# Patient Record
Sex: Female | Born: 1985 | Race: White | Hispanic: No | Marital: Married
Health system: Midwestern US, Community
[De-identification: ages and names within clinical notes are randomized; demographics above are authoritative.]

## PROBLEM LIST (undated history)

## (undated) DIAGNOSIS — Z8619 Personal history of other infectious and parasitic diseases: Secondary | ICD-10-CM

## (undated) DIAGNOSIS — M5136 Other intervertebral disc degeneration, lumbar region: Secondary | ICD-10-CM

## (undated) DIAGNOSIS — Z803 Family history of malignant neoplasm of breast: Secondary | ICD-10-CM

## (undated) DIAGNOSIS — Z800 Family history of malignant neoplasm of digestive organs: Secondary | ICD-10-CM

## (undated) DIAGNOSIS — M51369 Other intervertebral disc degeneration, lumbar region without mention of lumbar back pain or lower extremity pain: Secondary | ICD-10-CM

## (undated) DIAGNOSIS — Z8042 Family history of malignant neoplasm of prostate: Secondary | ICD-10-CM

## (undated) DIAGNOSIS — G4730 Sleep apnea, unspecified: Secondary | ICD-10-CM

## (undated) DIAGNOSIS — F32A Depression, unspecified: Secondary | ICD-10-CM

## (undated) DIAGNOSIS — J45909 Unspecified asthma, uncomplicated: Secondary | ICD-10-CM

## (undated) HISTORY — DX: Unspecified asthma, uncomplicated: J45.909

## (undated) HISTORY — DX: Family history of malignant neoplasm of breast: Z80.3

## (undated) HISTORY — DX: Personal history of other infectious and parasitic diseases: Z86.19

## (undated) HISTORY — DX: Other intervertebral disc degeneration, lumbar region: M51.36

## (undated) HISTORY — DX: Depression, unspecified: F32.A

## (undated) HISTORY — DX: Family history of malignant neoplasm of prostate: Z80.42

## (undated) HISTORY — DX: Sleep apnea, unspecified: G47.30

## (undated) HISTORY — DX: Family history of malignant neoplasm of digestive organs: Z80.0

## (undated) HISTORY — PX: FRACTURE SURGERY: SHX138

## (undated) HISTORY — PX: WISDOM TOOTH EXTRACTION: SHX21

## (undated) HISTORY — DX: Other intervertebral disc degeneration, lumbar region without mention of lumbar back pain or lower extremity pain: M51.369

## (undated) HISTORY — PX: FOREARM FRACTURE SURGERY: SHX649

---

## 2014-09-17 ENCOUNTER — Inpatient Hospital Stay
Admit: 2014-09-17 | Discharge: 2014-09-17 | Disposition: A | Discharge status: Home / Self Care | Attending: Emergency Medicine

## 2014-09-17 NOTE — ED Provider Notes (Signed)
PATIENT:          Nancy Chung, Nancy Chung         DOS:           09/17/2014  MR #:             [id number]             ACCOUNT #:     [id number]  DATE OF BIRTH:    Jun 14, 1986              AGE:           28      HISTORY OF PRESENT ILLNESS:    PERTINENT HISTORY OF PRESENT  ILLNESS. Patient seen under the  supervision of  Dr. Oscar Laaniel Celik.  Patient presents to the emergency department with 8  hour  history of back spasm.  She states she was bending over at [employer]  this  evening, when she had the acute onset of some pain in her lumbar back.  She  describes this as a throbbing spasm sensation.  Patient has a history  of back  spasms and has taken Flexeril in the past, with improvement of her  symptoms.  She states she tried to sleep, but could not secondary to pain.    PERTINENT PAST/ FAMILY/SOCIAL HISTORY Medical: None  Surgical: None  Social: Denies alcohol, tobacco, or other drug use      PHYSICAL EXAM Vital signs unremarkable  General: Well-nourished, well-developed female in mild distress  secondary to  pain  Back: Lumbar spinal and paraspinal tenderness to palpation, no CVA  tenderness,  and straight leg raise negative bilaterally the seated position  Extremities: Nontender, no edema  Neuro: Alert and oriented x3, strength and sensation in all 4  extremities    MEDICAL DECISION MAKING:    SIGNIFICANT FINDINGS/ED COURSE/MEDICAL DECISION MAKING/TREATMENT  PLAN Patient  was given Norflex and Toradol injections for her pain.  Given her  history of  back spasms and no recent history of trauma, we feel there is no need  to obtain  imaging or lab work this time.  Upon reassessment at 0 800, patient  reported  significant improvement of her back pain.  She will be discharged home  with a  prescription for Flexeril and Naprosyn.  Patient was instructed to  followup  with her primary care provider.    PROBLEM LIST:       Admit Reason:     Back Pain: Entered Date: 17-Sep-2014 05:08, Entered By:  INTERFACES,  INTERFACES       DIAGNOSIS 1.  Lumbar back spasm, acute      ADDITIONAL INFORMATION The Emergency Medicine attending physician  was present  in the Emergency Department, who reviewed case management, and  approved  evaluation/treatment.    Electronic Signatures:  Oscar LaELIK, DANIEL (MD)  (Signed 17-Sep-2014 08:59)   Co-Signer: HISTORY OF PRESENT ILLNESS, PROBLEM LIST  Ananias PilgrimSTEUBS, Kiandra Sanguinetti T (MD)  (Signed 17-Sep-2014 08:20)   Authored: HISTORY OF PRESENT ILLNESS, PHYSICAL EXAM, MEDICAL DECISION  MAKING,  PROBLEM LIST, DIAGNOSIS, Additional Infomation      Last Updated: 17-Sep-2014 08:59 by Oscar LaELIK, DANIEL (MD)            Please see T-Sheet, initial assessment, and physician orders for  further details.    Dictating Physician: Clover MealyJohn Alyannah Sanks, MD  Original Electronic Signature Date: 09/17/2014 05:37 A  JTS  Document #: [id number]    cc:  PCP No       So[place]

## 2015-05-03 LAB — PAP SMEAR

## 2017-05-23 ENCOUNTER — Other Ambulatory Visit: Payer: Self-pay | Admitting: Family Medicine

## 2017-05-23 ENCOUNTER — Encounter: Payer: Self-pay | Admitting: Family Medicine

## 2017-05-23 ENCOUNTER — Ambulatory Visit (INDEPENDENT_AMBULATORY_CARE_PROVIDER_SITE_OTHER): Payer: BC Managed Care – PPO | Admitting: Family Medicine

## 2017-05-23 VITALS — BP 120/70 | HR 67 | Ht 69.5 in | Wt 215.0 lb

## 2017-05-23 DIAGNOSIS — Z0000 Encounter for general adult medical examination without abnormal findings: Secondary | ICD-10-CM

## 2017-05-23 DIAGNOSIS — Z8349 Family history of other endocrine, nutritional and metabolic diseases: Secondary | ICD-10-CM

## 2017-05-23 LAB — CBC WITH DIFFERENTIAL/PLATELET
Basophils Absolute: 0 cells/uL (ref 0–200)
Basophils Relative: 0 %
EOS PCT: 1 %
Eosinophils Absolute: 71 cells/uL (ref 15–500)
HCT: 41.6 % (ref 35.0–45.0)
HEMOGLOBIN: 13.8 g/dL (ref 11.7–15.5)
LYMPHS ABS: 1562 {cells}/uL (ref 850–3900)
Lymphocytes Relative: 22 %
MCH: 28.8 pg (ref 27.0–33.0)
MCHC: 33.2 g/dL (ref 32.0–36.0)
MCV: 86.8 fL (ref 80.0–100.0)
MPV: 9.7 fL (ref 7.5–12.5)
Monocytes Absolute: 568 cells/uL (ref 200–950)
Monocytes Relative: 8 %
NEUTROS PCT: 69 %
Neutro Abs: 4899 cells/uL (ref 1500–7800)
PLATELETS: 236 10*3/uL (ref 140–400)
RBC: 4.79 MIL/uL (ref 3.80–5.10)
RDW: 13.4 % (ref 11.0–15.0)
WBC: 7.1 10*3/uL (ref 4.0–10.5)

## 2017-05-23 LAB — COMPREHENSIVE METABOLIC PANEL
ALBUMIN: 4.5 g/dL (ref 3.6–5.1)
ALT: 17 U/L (ref 6–29)
AST: 16 U/L (ref 10–30)
Alkaline Phosphatase: 55 U/L (ref 33–115)
BILIRUBIN TOTAL: 1.1 mg/dL (ref 0.2–1.2)
BUN: 10 mg/dL (ref 7–25)
CO2: 24 mmol/L (ref 20–31)
CREATININE: 0.71 mg/dL (ref 0.50–1.10)
Calcium: 9.1 mg/dL (ref 8.6–10.2)
Chloride: 103 mmol/L (ref 98–110)
GLUCOSE: 91 mg/dL (ref 65–99)
Potassium: 4.7 mmol/L (ref 3.5–5.3)
SODIUM: 138 mmol/L (ref 135–146)
Total Protein: 7.2 g/dL (ref 6.1–8.1)

## 2017-05-23 LAB — POCT URINALYSIS DIP (PROADVANTAGE DEVICE)
Bilirubin, UA: NEGATIVE
Blood, UA: NEGATIVE
Glucose, UA: NEGATIVE mg/dL
Ketones, POC UA: NEGATIVE mg/dL
LEUKOCYTES UA: NEGATIVE
Nitrite, UA: NEGATIVE
PROTEIN UA: NEGATIVE mg/dL
SPECIFIC GRAVITY, URINE: 1.03
UUROB: NEGATIVE
pH, UA: 6 (ref 5.0–8.0)

## 2017-05-23 LAB — LIPID PANEL
Cholesterol: 197 mg/dL (ref <200)
HDL: 36 mg/dL — ABNORMAL LOW (ref >50)
LDL CALC: 131 mg/dL — AB (ref <100)
Total CHOL/HDL Ratio: 5.5 Ratio — ABNORMAL HIGH (ref <5.0)
Triglycerides: 148 mg/dL (ref <150)
VLDL: 30 mg/dL (ref <30)

## 2017-05-23 LAB — TSH: TSH: 0.36 mIU/L — ABNORMAL LOW

## 2017-05-23 NOTE — Progress Notes (Signed)
Subjective:    Patient ID: Dawn Barajas, female    DOB: 12/28/1985, 31 y.o.   MRN: [id number]  HPI Chief Complaint  Patient presents with  . fasting cpe    fasting cpe, wants to be referred to gynecology for pap smears. [place]   She is new to the practice and here for a complete physical exam. Previous medical care: moved here from [place] in [place]. Had a baby 6 months ago. States she is not breast feeding. Vaginal birth and denies having any issues with blood sugars or BP during pregnancy. She would like to establish with an OB/GYN here.  Last CPE: no PCP in the past but OB/GYN visits.   History of back pain and lumbar DDD. No issues in 2-3 years.   Other providers: none here  Social history: Lives with her female spouse, works at [employer] at [employer].   Diet: fairly healthy. Cook at home.  Excerise: 2-3 days per week. Low impact.   Immunizations: Tdap in past year.   Health maintenance:  Mammogram: normal at age 31.  Colonoscopy: N/A Last Gynecological Exam: June 2017 and normal.  Last Menstrual cycle: 04/26/2017  Pregnancies: 1 Last Dental Exam: twice annually  Last Eye Exam: January 2018  Wears seatbelt always, uses sunscreen, smoke detectors in home and functioning, does not text while driving and feels safe in home environment.   Reviewed allergies, medications, past medical, surgical, family, and social history.    Review of Systems Review of Systems Constitutional: -fever, -chills, -sweats, -unexpected weight change,-fatigue ENT: -runny nose, -ear pain, -sore throat Cardiology:  -chest pain, -palpitations, -edema Respiratory: -cough, -shortness of breath, -wheezing Gastroenterology: -abdominal pain, -nausea, -vomiting, -diarrhea, -constipation  Hematology: -bleeding or bruising problems Musculoskeletal: -arthralgias, -myalgias, -joint swelling, -back pain Ophthalmology: -vision changes Urology: -dysuria, -difficulty urinating,  -hematuria, -urinary frequency, -urgency Neurology: -headache, -weakness, -tingling, -numbness       Objective:   Physical Exam BP 120/70   Pulse 67   Ht 5' 9.5" (1.765 m)   Wt 215 lb (97.5 kg)   LMP 04/26/2017   BMI 31.29 kg/m   General Appearance:    Alert, cooperative, no distress, appears stated age  Head:    Normocephalic, without obvious abnormality, atraumatic  Eyes:    PERRL, conjunctiva/corneas clear, EOM's intact, fundi    benign  Ears:    Normal TM's and external ear canals  Nose:   Nares normal, mucosa normal, no drainage or sinus   tenderness  Throat:   Lips, mucosa, and tongue normal; teeth and gums normal  Neck:   Supple, no lymphadenopathy;  thyroid:  no   enlargement/tenderness/nodules; no carotid   bruit or JVD  Back:    Spine nontender, no curvature, ROM normal, no CVA     tenderness  Lungs:     Clear to auscultation bilaterally without wheezes, rales or     ronchi; respirations unlabored  Chest Wall:    No tenderness or deformity   Heart:    Regular rate and rhythm, S1 and S2 normal, no murmur, rub   or gallop  Breast Exam:    Done in past 6 months.declines.  No axillary lymphadenopathy  Abdomen:     Soft, non-tender, nondistended, normoactive bowel sounds,    no masses, no hepatosplenomegaly  Genitalia:    Done in past 6 months.   Rectal:    Not performed due to age<40 and no related complaints  Extremities:  No clubbing, cyanosis or edema  Pulses:   2+ and symmetric all extremities  Skin:   Skin color, texture, turgor normal, no rashes or lesions  Lymph nodes:   Cervical, supraclavicular, and axillary nodes normal  Neurologic:   CNII-XII intact, normal strength, sensation and gait; reflexes 2+ and symmetric throughout          Psych:   Normal mood, affect, hygiene and grooming.     Urinalysis dipstick: neg      Assessment & Plan:  Routine general medical examination at a health care facility - Plan: POCT Urinalysis DIP (Proadvantage Device), CBC  with Differential/Platelet, Comprehensive metabolic panel, TSH, Lipid panel  Family history of thyroid disease in mother - Plan: TSH  Discussed that she appears to be healthy overall. Her son has hand, foot and mouth and discussed that if she does get this that supportive care is the treatment plan.  Counseled on healthy diet and exercise.  She will schedule with an OB/GYN per her preference. Numbers provided.  She is up to date on immunizations and health maintenance.  Labs done and will follow up.

## 2017-05-23 NOTE — Patient Instructions (Signed)
You can call and schedule your Dentist appointment at any of the following offices:   Dawn Barajas Address: [address]  [place], [place] [address] Phone #: [phone]  Dawn Barajas DDS Address: [address] [place], [place] [address] Phone # [phone]  Dawn Barajas, DDS [facility]  Address: [address]                                                                 [place], [place] [address] Phone #: [phone]  Here is a list of Eye Doctors that you call and schedule an appointment with.   [facility] Address: [address] [place], [place] [address] Phone: [phone]   [facility] Address: [address], [place], [place] [address] Phone: [phone]  Dr. Dione Booze Address: [address] [place], [place] [address] Phone: [phone]  [facility] [address] [place], [place]  [address] Telephone: [phone]  Preventative Care for Adults - Female      MAINTAIN REGULAR HEALTH EXAMS:  A routine yearly physical is a good way to check in with your primary care provider about your health and preventive screening. It is also an opportunity to share updates about your health and any concerns you have, and receive a thorough all-over exam.   Most health insurance companies pay for at least some preventative services.  Check with your health plan for specific coverages.  WHAT PREVENTATIVE SERVICES DO WOMEN NEED?  Adult women should have their weight and blood pressure checked regularly.   Women age 54 and older should have their cholesterol levels checked regularly.  Women should be screened for cervical cancer with a Pap smear and pelvic exam beginning at either age 27, or 3 years after they become sexually activity.    Breast cancer screening generally begins at age 94 with a mammogram and breast exam by your primary care provider.    Beginning at age 11 and  continuing to age 77, women should be screened for colorectal cancer.  Certain people may need continued testing until age 17.  Updating vaccinations is part of preventative care.  Vaccinations help protect against diseases such as the flu.  Osteoporosis is a disease in which the bones lose minerals and strength as we age. Women ages 32 and over should discuss this with their caregivers, as should women after menopause who have other risk factors.  Lab tests are generally done as part of preventative care to screen for anemia and blood disorders, to screen for problems with the kidneys and liver, to screen for bladder problems, to check blood sugar, and to check your cholesterol level.  Preventative services generally include counseling about diet, exercise, avoiding tobacco, drugs, excessive alcohol consumption, and sexually transmitted infections.    GENERAL RECOMMENDATIONS FOR GOOD HEALTH:  Healthy diet:  Eat a variety of foods, including fruit, vegetables, animal or vegetable protein, such as meat, fish, chicken, and eggs, or beans, lentils, tofu, and grains, such as rice.  Drink plenty of water daily.  Decrease saturated fat in the diet, avoid lots of red meat, processed foods, sweets, fast foods, and fried foods.  Exercise:  Aerobic exercise helps maintain good heart health.  At least 30-40 minutes of moderate-intensity exercise is recommended. For example, a brisk walk that increases your heart rate and breathing. This should be done on most days of the week.   Find a type of exercise or a variety of exercises that you enjoy so that it becomes a part of your daily life.  Examples are running, walking, swimming, water aerobics, and biking.  For motivation and support, explore group exercise such as aerobic class, spin class, Zumba, Yoga,or  martial arts, etc.    Set exercise goals for yourself, such as a certain weight goal, walk or run in a race such as a 5k walk/run.  Speak to your  primary care provider about exercise goals.  Disease prevention:  If you smoke or chew tobacco, find out from your caregiver how to quit. It can literally save your life, no matter how long you have been a tobacco user. If you do not use tobacco, never begin.   Maintain a healthy diet and normal weight. Increased weight leads to problems with blood pressure and diabetes.   The Body Mass Index or BMI is a way of measuring how much of your body is fat. Having a BMI above 27 increases the risk of heart disease, diabetes, hypertension, stroke and other problems related to obesity. Your caregiver can help determine your BMI and based on it develop an exercise and dietary program to help you achieve or maintain this important measurement at a healthful level.  High blood pressure causes heart and blood vessel problems.  Persistent high blood pressure should be treated with medicine if weight loss and exercise do not work.   Fat and cholesterol leaves deposits in your arteries that can block them. This causes heart disease and vessel disease elsewhere in your body.  If your cholesterol is found to be high, or if you have heart disease or certain other medical conditions, then you may need to have your cholesterol monitored frequently and be treated with medication.   Ask if you should have a cardiac stress test if your history suggests this. A stress test is a test done on a treadmill that looks for heart disease. This test can find disease prior to there being a problem.  Menopause can be associated with physical symptoms and risks. Hormone replacement therapy is available to decrease these. You should talk to your caregiver about whether starting or continuing to take hormones is right for you.   Osteoporosis is a disease in which the bones lose minerals and strength as we age. This can result in serious bone fractures. Risk of osteoporosis can be identified using a bone density scan. Women ages 21 and  over should discuss this with their caregivers, as should women after menopause who have other risk factors. Ask your caregiver whether you should be taking a calcium supplement and Vitamin D, to reduce the rate of osteoporosis.   Avoid drinking alcohol in excess (more than two drinks per day).  Avoid use of street drugs. Do not share needles with anyone. Ask for professional help if you need assistance or instructions on stopping the use of alcohol, cigarettes, and/or drugs.  Brush your teeth twice a day with fluoride toothpaste, and floss once a day. Good oral hygiene prevents tooth decay and gum disease. The problems can be painful, unattractive, and can cause other health problems. Visit your dentist for a routine oral and dental check up and preventive care every 6-12 months.   Look at your skin regularly.  Use a mirror to look at your back. Notify your caregivers of changes in moles, especially if there are changes in shapes, colors, a size larger than a pencil eraser, an irregular border, or development of new moles.  Safety:  Use seatbelts 100% of the time, whether driving or as a passenger.  Use safety devices such as hearing protection if you work in environments with loud noise or significant background noise.  Use safety glasses when doing any work that could send debris in to the eyes.  Use a helmet if you ride a bike or motorcycle.  Use appropriate safety gear for contact sports.  Talk to your caregiver about gun safety.  Use sunscreen with a SPF (or skin protection factor) of 15 or greater.  Lighter skinned people are at a greater risk of skin cancer. Don't forget to also wear sunglasses in order to protect your eyes from too much damaging sunlight. Damaging sunlight can accelerate cataract formation.   Practice safe sex. Use condoms. Condoms are used for birth control and to help reduce the spread of sexually transmitted infections (or STIs).  Some of the STIs are gonorrhea (the clap),  chlamydia, syphilis, trichomonas, herpes, HPV (human papilloma virus) and HIV (human immunodeficiency virus) which causes AIDS. The herpes, HIV and HPV are viral illnesses that have no cure. These can result in disability, cancer and death.   Keep carbon monoxide and smoke detectors in your home functioning at all times. Change the batteries every 6 months or use a model that plugs into the wall.   Vaccinations:  Stay up to date with your tetanus shots and other required immunizations. You should have a booster for tetanus every 10 years. Be sure to get your flu shot every year, since 5%-20% of the [place] population comes down with the flu. The flu vaccine changes each year, so being vaccinated once is not enough. Get your shot in the fall, before the flu season peaks.   Other vaccines to consider:  Human Papilloma Virus or HPV causes cancer of the cervix, and other infections that can be transmitted from person to person. There is a vaccine for HPV, and females should get immunized between the ages of 3011 and 426. It requires a series of 3 shots.   Pneumococcal vaccine to protect against certain types of pneumonia.  This is normally recommended for adults age 31 or older.  However, adults younger than 31 years old with certain underlying conditions such as diabetes, heart or lung disease should also receive the vaccine.  Shingles vaccine to protect against Varicella Zoster if you are older than age 31, or younger than 31 years old with certain underlying illness.  Hepatitis A vaccine to protect against a form of infection of the liver by a virus acquired from food.  Hepatitis B vaccine to protect against a form of infection of the liver by a virus acquired from blood or body fluids, particularly if you work in health care.  If you plan to travel internationally, check with your local health department for specific vaccination recommendations.  Cancer Screening:  Breast cancer screening is  essential to preventive care for women. All women age 31 and older should perform a breast self-exam every month. At age 31 and older, women should have their caregiver complete a breast exam each year. Women at ages 6740 and older should have a mammogram (x-ray film) of the breasts. Your caregiver can discuss how often you need mammograms.    Cervical cancer  screening includes taking a Pap smear (sample of cells examined under a microscope) from the cervix (end of the uterus). It also includes testing for HPV (Human Papilloma Virus, which can cause cervical cancer). Screening and a pelvic exam should begin at age 31, or 3 years after a woman becomes sexually active. Screening should occur every year, with a Pap smear but no HPV testing, up to age 79. After age 16, you should have a Pap smear every 3 years with HPV testing, if no HPV was found previously.   Most routine colon cancer screening begins at the age of 50. On a yearly basis, doctors may provide special easy to use take-home tests to check for hidden blood in the stool. Sigmoidoscopy or colonoscopy can detect the earliest forms of colon cancer and is life saving. These tests use a small camera at the end of a tube to directly examine the colon. Speak to your caregiver about this at age 15, when routine screening begins (and is repeated every 5 years unless early forms of pre-cancerous polyps or small growths are found).

## 2017-05-26 LAB — T3: T3, Total: 90.6 ng/dL (ref 76–181)

## 2017-05-26 LAB — T4, FREE: FREE T4: 1.2 ng/dL (ref 0.8–1.8)

## 2017-05-29 ENCOUNTER — Encounter: Payer: Self-pay | Admitting: Internal Medicine

## 2017-07-09 ENCOUNTER — Encounter: Payer: Self-pay | Admitting: Family Medicine

## 2017-07-09 ENCOUNTER — Ambulatory Visit (INDEPENDENT_AMBULATORY_CARE_PROVIDER_SITE_OTHER): Payer: BC Managed Care – PPO | Admitting: Family Medicine

## 2017-07-09 VITALS — BP 110/72 | HR 68 | Temp 98.0°F | Wt 211.4 lb

## 2017-07-09 DIAGNOSIS — J029 Acute pharyngitis, unspecified: Secondary | ICD-10-CM

## 2017-07-09 LAB — POCT RAPID STREP A (OFFICE): RAPID STREP A SCREEN: NEGATIVE

## 2017-07-09 MED ORDER — AMOXICILLIN 875 MG PO TABS: 875.0000 mg | ORAL_TABLET | Freq: Two times a day (BID) | ORAL | 0 refills | Status: DC

## 2017-07-09 NOTE — Patient Instructions (Signed)
Complete the antibiotic, do salt water gargles, stay hydrated. You can take tylenol or ibuprofen as needed.  Let me know if you have any new or worsening symptoms or if you are not back to baseline after completing the antibiotic.   Pharyngitis Pharyngitis is redness, pain, and swelling (inflammation) of your pharynx. What are the causes? Pharyngitis is usually caused by infection. Most of the time, these infections are from viruses (viral) and are part of a cold. However, sometimes pharyngitis is caused by bacteria (bacterial). Pharyngitis can also be caused by allergies. Viral pharyngitis may be spread from person to person by coughing, sneezing, and personal items or utensils (cups, forks, spoons, toothbrushes). Bacterial pharyngitis may be spread from person to person by more intimate contact, such as kissing. What are the signs or symptoms? Symptoms of pharyngitis include:  Sore throat.  Tiredness (fatigue).  Low-grade fever.  Headache.  Joint pain and muscle aches.  Skin rashes.  Swollen lymph nodes.  Plaque-like film on throat or tonsils (often seen with bacterial pharyngitis).  How is this diagnosed? Your health care provider will ask you questions about your illness and your symptoms. Your medical history, along with a physical exam, is often all that is needed to diagnose pharyngitis. Sometimes, a rapid strep test is done. Other lab tests may also be done, depending on the suspected cause. How is this treated? Viral pharyngitis will usually get better in 3-4 days without the use of medicine. Bacterial pharyngitis is treated with medicines that kill germs (antibiotics). Follow these instructions at home:  Drink enough water and fluids to keep your urine clear or pale yellow.  Only take over-the-counter or prescription medicines as directed by your health care provider: ? If you are prescribed antibiotics, make sure you finish them even if you start to feel better. ? Do  not take aspirin.  Get lots of rest.  Gargle with 8 oz of salt water ( tsp of salt per 1 qt of water) as often as every 1-2 hours to soothe your throat.  Throat lozenges (if you are not at risk for choking) or sprays may be used to soothe your throat. Contact a health care provider if:  You have large, tender lumps in your neck.  You have a rash.  You cough up green, yellow-brown, or bloody spit. Get help right away if:  Your neck becomes stiff.  You drool or are unable to swallow liquids.  You vomit or are unable to keep medicines or liquids down.  You have severe pain that does not go away with the use of recommended medicines.  You have trouble breathing (not caused by a stuffy nose). This information is not intended to replace advice given to you by your health care provider. Make sure you discuss any questions you have with your health care provider. Document Released: 11/11/2005 Document Revised: 04/18/2016 Document Reviewed: 07/19/2013 Elsevier Interactive Patient Education  2017 [employer]

## 2017-07-09 NOTE — Progress Notes (Signed)
Subjective:  Darl HouseholderChelsea Rummell is a 31 y.o. female who presents for evaluation of sore throat.  She has not had a recent close exposure to someone with proven streptococcal pharyngitis.  Associated symptoms include a 3 day history of sore throat, swollen and tender glands, and bilateral ear discomfort.   No fever, chills, headache, rhinorrhea, nasal congestion, cough, abdominal pain, N/V/D.  History of strep throat.   Treatment to date: none.  ? sick contacts.  No other aggravating or relieving factors.  No other c/o.  The following portions of the patient's history were reviewed and updated as appropriate: allergies, current medications, past medical history, past social history, past surgical history and problem list.  ROS as in subjective   Objective: Vitals:   07/09/17 0831  BP: 110/72  Pulse: 68  Temp: 98 F (36.7 C)    General appearance: no distress, WD/WN, is not ill-appearing HEENT: normocephalic, conjunctiva/corneas normal, sclerae anicteric, nares patent, no discharge or erythema, pharynx with erythema, and exudate no edema.  Oral cavity: MMM, no lesions  Neck: supple, mild anterior cervical lymphadenopathy and tenderness, no thyromegaly Heart: RRR, normal S1, S2, no murmurs Lungs: CTA bilaterally, no wheezes, rhonchi, or rales   Laboratory Strep test done. Results:negative.    Assessment and Plan: Acute pharyngitis, unspecified etiology - Plan: amoxicillin (AMOXIL) 875 MG tablet  Sore throat - Plan: POCT rapid strep A   Based on [facility] criteria I am treating her with Amoxil.   Discussed symptomatic treatment including salt water gargles, warm fluids, rest, hydrate well, can use over-the-counter Tylenol or Ibuprofen for throat pain, fever, or malaise. If worse or not back to baseline after completing the antibiotic let me know.

## 2017-11-07 ENCOUNTER — Encounter: Payer: Self-pay | Admitting: Family Medicine

## 2017-11-07 ENCOUNTER — Ambulatory Visit: Payer: BC Managed Care – PPO | Admitting: Family Medicine

## 2017-11-07 VITALS — BP 118/80 | HR 88 | Temp 98.2°F | Ht 69.0 in | Wt 218.2 lb

## 2017-11-07 DIAGNOSIS — J0140 Acute pansinusitis, unspecified: Secondary | ICD-10-CM | POA: Diagnosis not present

## 2017-11-07 MED ORDER — AMOXICILLIN-POT CLAVULANATE 875-125 MG PO TABS: 1.0000 | ORAL_TABLET | Freq: Two times a day (BID) | ORAL | 0 refills | Status: DC

## 2017-11-07 NOTE — Progress Notes (Signed)
Chief Complaint  Patient presents with  . Acute Visit    had since thursday morning, sinus pressure, head pain, blowing nose, left ear aching, coughing, sore throat, OTC medications tried no relief    Subjective:  Dawn Barajas is a 31 y.o. female who presents for possible sinus infection.  Symptoms include a 8-9 day history of nasal congestion, sinus pain that is worse on the left side and left ear discomfort especially when blowing her nose.  Reports purulent nasal drainage, postnasal drainage and cough that is mainly dry.   Denies fever, chills, body aches, chest pain, palpitations, shortness of breath, wheezing, abdominal pain, N/V/D.   Denies history of pneumonia, asthma or bronchitis.  No recent antibiotics.   Past history is significant for no history of pneumonia or bronchitis. Patient is a non-smoker.  Using multiple OTC medications for symptoms.  Denies sick contacts.  No other aggravating or relieving factors.  No other c/o.  ROS as in subjective   Objective: Vitals:   11/07/17 0809  BP: 118/80  Pulse: 88  Temp: 98.2 F (36.8 C)  SpO2: 98%    General appearance: Alert, WD/WN, no distress                             Skin: warm, no rash                           Head: + left frontal and maxillary sinus tenderness,                            Eyes: conjunctiva normal, corneas clear, PERRLA                            Ears: pearly TMs, external ear canals normal                          Nose: septum midline, turbinates swollen, with erythema and clear discharge             Mouth/throat: MMM, tongue normal, mild pharyngeal erythema without edema or exudate                           Neck: supple, no adenopathy, no thyromegaly, nontender                          Heart: RRR, normal S1, S2, no murmurs                         Lungs: CTA bilaterally, no wheezes, rales, or rhonchi       Assessment and Plan: Acute non-recurrent pansinusitis - Plan: amoxicillin-clavulanate  (AUGMENTIN) 875-125 MG tablet  Prescription sent for Augmentin.  Discussed taking a probiotic or eating yogurt daily while on the medication.  Can use OTC Mucinex for congestion.  Tylenol or Ibuprofen OTC for fever and malaise.  Discussed symptomatic relief, nasal saline flush, and call or return if worse or not back to baseline after completing the antibiotic.

## 2018-02-25 ENCOUNTER — Encounter: Payer: Self-pay | Admitting: Medical

## 2018-02-25 ENCOUNTER — Ambulatory Visit: Payer: BC Managed Care – PPO | Admitting: Medical

## 2018-02-25 VITALS — BP 112/78 | HR 71 | Temp 98.2°F | Ht 69.0 in | Wt 212.2 lb

## 2018-02-25 DIAGNOSIS — H109 Unspecified conjunctivitis: Secondary | ICD-10-CM | POA: Diagnosis not present

## 2018-02-25 DIAGNOSIS — J069 Acute upper respiratory infection, unspecified: Secondary | ICD-10-CM | POA: Diagnosis not present

## 2018-02-25 MED ORDER — POLYMYXIN B-TRIMETHOPRIM 10000-0.1 UNIT/ML-% OP SOLN: 2.0000 [drp] | Freq: Four times a day (QID) | OPHTHALMIC | 0 refills | Status: DC

## 2018-02-25 NOTE — Patient Instructions (Addendum)
Thank you for giving me the opportunity to serve you today.    Your diagnosis today includes: Encounter Diagnoses  Name Primary?  . Conjunctivitis of both eyes, unspecified conjunctivitis type Yes  . Upper respiratory tract infection, unspecified type     Specific recommendations today include:                                                          Pink eye is very contagious and spreads by direct contact.    You may be given antibiotic eyedrops as part of your treatment. Before using your eye medicine, remove all drainage from the eye by washing gently with warm water and cotton balls.   Continue to use the medication until you have awakened 2 mornings in a row without discharge from the eye.   Do not rub your eye. This increases the irritation and helps spread infection.   Use separate towels from other household members.   Wash your hands with soap and water before and after touching your eyes.   Use cold compresses to reduce pain and sunglasses to relieve irritation from light.  Do not wear contact lenses or wear eye makeup until the infection is gone.  You can use OTC mucinex DM for cough and congestion, hydrate well.  Symptoms seem to be gradually resolving   Call or return if worse or not seeing improvement in the next few days.   Medication costs:  If you get to the pharmacy and medication prescribed today was either too expensive, not covered by your insurance, or required prior authorization, then please call us back to let us know.  We often have no way to know if a medication is too expensive or not covered by your insurance.  Thanks for your cooperation.    I have included other useful information below for your review.   Bacterial Conjunctivitis Bacterial conjunctivitis (commonly called pink eye) is redness, soreness, or puffiness (inflammation) of the white part of your eye. It is caused by a germ called bacteria. These germs can easily spread from person to  person (contagious). Your eye often will become red or pink. Your eye may also become irritated, watery, or have a thick discharge.  HOME CARE   Apply a cool, clean washcloth over closed eyelids. Do this for 10-20 minutes, 3-4 times a day while you have pain.  Gently wipe away any fluid coming from the eye with a warm, wet washcloth or cotton ball.  Wash your hands often with soap and water. Use paper towels to dry your hands.  Do not share towels or washcloths.  Change or wash your pillowcase every day.  Do not use eye makeup until the infection is gone.  Do not use machines or drive if your vision is blurry.  Stop using contact lenses. Do not use them again until your doctor says it is okay.  Do not touch the tip of the eye drop bottle or medicine tube with your fingers when you put medicine on the eye. GET HELP RIGHT AWAY IF:   Your eye is not better after 3 days of starting your medicine.  You have a yellowish fluid coming out of the eye.  You have more pain in the eye.  Your eye redness is spreading.  Your vision becomes  blurry.  You have a fever or lasting symptoms for more than 2-3 days.  You have a fever and your symptoms suddenly get worse.  You have pain in the face.  Your face gets red or puffy (swollen). MAKE SURE YOU:   Understand these instructions.  Will watch this condition.  Will get help right away if you are not doing well or get worse. Document Released: 08/20/2008 Document Revised: 10/28/2012 Document Reviewed: 07/17/2012 [facility] Patient Information 2015 [place], [place]. This information is not intended to replace advice given to you by your health care provider. Make sure you discuss any questions you have with your health care provider.    Viral Conjunctivitis Conjunctivitis is an irritation (inflammation) of the clear membrane that covers the white part of the eye (the conjunctiva). The irritation can also happen on the underside of the  eyelids. Conjunctivitis makes the eye red or pink in color. This is what is commonly known as pink eye. Viral conjunctivitis can spread easily (contagious). CAUSES   Infection from virus on the surface of the eye.  Infection from the irritation or injury of nearby tissues such as the eyelids or cornea.  More serious inflammation or infection on the inside of the eye.  Other eye diseases.  The use of certain eye medications. SYMPTOMS  The normally white color of the eye or the underside of the eyelid is usually pink or red in color. The pink eye is usually associated with irritation, tearing and some sensitivity to light. Viral conjunctivitis is often associated with a clear, watery discharge. If a discharge is present, there may also be some blurred vision in the affected eye. DIAGNOSIS  Conjunctivitis is diagnosed by an eye exam. The eye specialist looks for changes in the surface tissues of the eye which take on changes characteristic of the specific types of conjunctivitis. A sample of any discharge may be collected on a Q-Tip (sterile swap). The sample will be sent to a lab to see whether or not the inflammation is caused by bacterial or viral infection. TREATMENT  Viral conjunctivitis will not respond to medicines that kill germs (antibiotics). Treatment is aimed at stopping a bacterial infection on top of the viral infection. The goal of treatment is to relieve symptoms (such as itching) with antihistamine drops or other eye medications.  HOME CARE INSTRUCTIONS   To ease discomfort, apply a cool, clean wash cloth to your eye for 10 to 20 minutes, 3 to 4 times a day.  Gently wipe away any drainage from the eye with a warm, wet washcloth or a cotton ball.  Wash your hands often with soap and use paper towels to dry.  Do not share towels or washcloths. This may spread the infection.  Change or wash your pillowcase every day.  You should not use eye make-up until the infection is  gone.  Stop using contacts lenses. Ask your eye professional how to sterilize or replace them before using again. This depends on the type of contact lenses used.  Do not touch the edge of the eyelid with the eye drop bottle or ointment tube when applying medications to the affected eye. This will stop you from spreading the infection to the other eye or to others. SEEK IMMEDIATE MEDICAL CARE IF:   The infection has not improved within 3 days of beginning treatment.  A watery discharge from the eye develops.  Pain in the eye increases.  The redness is spreading.  Vision becomes blurred.  An  oral temperature above 102 F (38.9 C) develops, or as your caregiver suggests.  Facial pain, redness or swelling develops.  Any problems that may be related to the prescribed medicine develop. MAKE SURE YOU:   Understand these instructions.  Will watch your condition.  Will get help right away if you are not doing well or get worse. Document Released: 11/11/2005 Document Revised: 02/03/2012 Document Reviewed: 06/30/2008 [facility] Patient Information 2015 [place], [place]. This information is not intended to replace advice given to you by your health care provider. Make sure you discuss any questions you have with your health care provider.

## 2018-02-25 NOTE — Progress Notes (Signed)
Subjective: Chief Complaint  Patient presents with  . Acute Visit    left eye swollen, burning in eye, congested 3x weeks   Here for left eye swollen, red, crusted over.  Right eye was ok, but its starting to get the same symptoms. She notes cold symptom x 1.5+ weeks of head congestion, head pressure, drainage and phlegm in back of throat.   No cough.  Has been taking some mucinex and nasal decongestant.  No fever, no SOB, no wheezing, no NVD, no hemoptysis.  No vision changes.    Son is 1 yo, and he had a cold and sinus infection last week.  Had pink eye as well.  Was on oral antibiotic and eye drop.  Past Medical History:  Diagnosis Date  . Childhood asthma   . History of chicken pox   . Lumbar degenerative disc disease    Current Outpatient Medications on File Prior to Visit  Medication Sig Dispense Refill  . Multiple Vitamin (MULTIVITAMIN) tablet Take 1 tablet by mouth daily.     No current facility-administered medications on file prior to visit.    ROS as in subjective    Objective: BP 112/78 (BP Location: Right Arm, Patient Position: Sitting, Cuff Size: Normal)   Pulse 71   Temp 98.2 F (36.8 C) (Oral)   Ht 5\' 9"  (1.753 m)   Wt 212 lb 3.2 oz (96.3 kg)   LMP 02/03/2018 (Approximate)   SpO2 98%   BMI 31.34 kg/m   General appearance: alert, no distress Left eye: conjunctiva red/injected, mild watery discharge, no swelling, eyelids with mild crusting Right eye: conjunctiva red/injected, mild watery discharge, no swelling, eyelids mild crusting PERRLA, EOMi, eyes otherwise unremarkable Ears: TMs pearly, canals normal Nares patent, no discharge or erythema Pharynx mild erythema, no exudate  Oral cavity: MMM, no lesions Neck: supple, no lymphadenopathy, no thyromegaly, no masses Lungs clear       Assessment: Encounter Diagnoses  Name Primary?  . Conjunctivitis of both eyes, unspecified conjunctivitis type Yes  . Upper respiratory tract infection, unspecified type       Plan: Medications prescribed today: Polytrim  Discussed diagnosis of conjunctivitis/pink eye.  Advised that pink eye is very contagious and spreads by direct contact.  Discussed treatment including moist compresses, avoid rubbing eyes, do not wear contact lenses or makeup until infection is resolved.  Discussed prevention, hand washing, not rubbing eyes.    URI - discussed supportive care  Patient was advised to call or return if worse or not improving in the next few days.    Patient voiced understanding of diagnosis, recommendations, and treatment plan.  After visit summary given.

## 2018-03-02 ENCOUNTER — Encounter: Payer: Self-pay | Admitting: Medical

## 2018-03-02 ENCOUNTER — Ambulatory Visit: Payer: BC Managed Care – PPO | Admitting: Medical

## 2018-03-02 DIAGNOSIS — J0100 Acute maxillary sinusitis, unspecified: Secondary | ICD-10-CM | POA: Diagnosis not present

## 2018-03-02 DIAGNOSIS — H1033 Unspecified acute conjunctivitis, bilateral: Secondary | ICD-10-CM | POA: Diagnosis not present

## 2018-03-02 DIAGNOSIS — J329 Chronic sinusitis, unspecified: Secondary | ICD-10-CM | POA: Insufficient documentation

## 2018-03-02 DIAGNOSIS — H109 Unspecified conjunctivitis: Secondary | ICD-10-CM | POA: Insufficient documentation

## 2018-03-02 MED ORDER — AMOXICILLIN 875 MG PO TABS: 875.0000 mg | ORAL_TABLET | Freq: Two times a day (BID) | ORAL | 0 refills | Status: DC

## 2018-03-02 NOTE — Progress Notes (Signed)
Subjective: Chief Complaint  Patient presents with  . Follow-up    head congestion is no better. eye drops are not helping either, effecting vision   Here for recheck.  I saw her 5 days ago for pink eye symptoms and she had noted some lingering URI.   Since last visit sees some improvement in eye redness and crusting, but no improvement in URI symptoms.  She still has cough, productive of green mucous, sinus pressure, drainage, ongoing irritation, redness, and morning crusting of eyes.   Has been taking some mucinex and nasal decongestant.  No fever, no SOB, no wheezing, no NVD, no hemoptysis.  No vision changes.    Son is 1 yo, and he had a cold and sinus infection last week.  Had pink eye as well.  Was on oral antibiotic and eye drop. Under some stress with being a mom, having moved to [place] in recent months, and just found out mother is in the hospital.  Past Medical History:  Diagnosis Date  . Childhood asthma   . History of chicken pox   . Lumbar degenerative disc disease    Current Outpatient Medications on File Prior to Visit  Medication Sig Dispense Refill  . Multiple Vitamin (MULTIVITAMIN) tablet Take 1 tablet by mouth daily.     No current facility-administered medications on file prior to visit.    ROS as in subjective    Objective: BP 128/84 (BP Location: Right Arm, Patient Position: Sitting, Cuff Size: Normal)   Pulse 66   Ht 5' 9.7" (1.77 m)   Wt 213 lb 9.6 oz (96.9 kg)   LMP 03/02/2018   SpO2 98%   BMI 30.91 kg/m   General appearance: alert, no distress bilat eyes without erythema crusting or discharge, EOMi, PERRLA minimal tenderness over maxillary sinuses PERRLA, EOMi, eyes otherwise unremarkable Ears: TMs pearly, canals normal Nares patent, no discharge or erythema Pharynx mild erythema, no exudate  Oral cavity: MMM, no lesions Neck: supple, no lymphadenopathy, no thyromegaly, no masses Lungs clear       Assessment: Encounter Diagnoses  Name Primary?   . Acute non-recurrent maxillary sinusitis   . Acute conjunctivitis of both eyes, unspecified acute conjunctivitis type      Plan: Maxillary sinusitis - discussed supportive care, rest, hydration, and can begin amoxicillin  conjunctivitis - reassured, exam findings improved.  Use Polytrim drops another 2-3 days, c/t good hygiene measures.   Avoid spread to others.     Patient was advised to call or return if worse or not improving in the next few days.    Patient voiced understanding of diagnosis, recommendations, and treatment plan.  Dawn Barajas was seen today for follow-up.  Diagnoses and all orders for this visit:  Acute non-recurrent maxillary sinusitis  Acute conjunctivitis of both eyes, unspecified acute conjunctivitis type  Other orders -     amoxicillin (AMOXIL) 875 MG tablet; Take 1 tablet (875 mg total) by mouth 2 (two) times daily.

## 2018-03-02 NOTE — Patient Instructions (Signed)
Ophthalmology Dr. Howard McFarland [address], [place], [place] [address] [phone]   [facility] Dr. Timothy Koop [address] [place], [place] [address]  [phone] [email or web address]   Sigmund S. Gould, M.D. Jason A. Gould, O.D. [address], [address] , [place] [address] Medical telephone: [phone] Optical telephone: [phone]  

## 2018-06-17 LAB — HM PAP SMEAR: HM Pap smear: NEGATIVE

## 2018-12-25 ENCOUNTER — Ambulatory Visit: Payer: BC Managed Care – PPO | Admitting: Family Medicine

## 2019-01-04 ENCOUNTER — Encounter: Payer: Self-pay | Admitting: Family Medicine

## 2019-08-10 ENCOUNTER — Other Ambulatory Visit: Payer: Self-pay

## 2019-08-10 DIAGNOSIS — Z20822 Contact with and (suspected) exposure to covid-19: Secondary | ICD-10-CM

## 2019-08-12 LAB — NOVEL CORONAVIRUS, NAA: SARS-CoV-2, NAA: NOT DETECTED

## 2019-09-28 ENCOUNTER — Other Ambulatory Visit: Payer: Self-pay

## 2019-09-28 DIAGNOSIS — Z20822 Contact with and (suspected) exposure to covid-19: Secondary | ICD-10-CM

## 2019-09-29 LAB — NOVEL CORONAVIRUS, NAA: SARS-CoV-2, NAA: NOT DETECTED

## 2020-03-02 ENCOUNTER — Other Ambulatory Visit: Payer: Self-pay

## 2020-03-02 ENCOUNTER — Encounter: Payer: Self-pay | Admitting: Family Medicine

## 2020-03-02 ENCOUNTER — Ambulatory Visit: Payer: BC Managed Care – PPO | Admitting: Family Medicine

## 2020-03-02 VITALS — BP 120/70 | HR 89 | Temp 98.6°F | Wt 218.6 lb

## 2020-03-02 DIAGNOSIS — F329 Major depressive disorder, single episode, unspecified: Secondary | ICD-10-CM | POA: Diagnosis not present

## 2020-03-02 DIAGNOSIS — N920 Excessive and frequent menstruation with regular cycle: Secondary | ICD-10-CM | POA: Diagnosis not present

## 2020-03-02 DIAGNOSIS — R5383 Other fatigue: Secondary | ICD-10-CM | POA: Diagnosis not present

## 2020-03-02 DIAGNOSIS — F32A Depression, unspecified: Secondary | ICD-10-CM | POA: Insufficient documentation

## 2020-03-02 NOTE — Progress Notes (Signed)
   Subjective:    Patient ID: Dawn Barajas, female    DOB: 1986/11/16, 34 y.o.   MRN: [id number]  HPI Chief Complaint  Patient presents with  . consult    wants referral to mental health.    She is here to discuss feeling depressed. Reports a history of depression. States depression has worsened over the past 3 months since returning to her job. She has never taken medication for depression. Has not been involved in counseling but would like to start.  Reports several triggers for her depression including work burnout, family stress, Covid, etc.   She has been feeling more tired recently. Heavy periods lately as well.   Denies self medicating with alcohol or drugs.  States she is sleeping fine but staying up later that usual. Appetite is good.  She is not exercising.   Denies fever, chills, dizziness, chest pain, palpitations, shortness of breath, abdominal pain, N/V/D, urinary symptoms, LE edema.     Depression screen [facility] 2/9 03/02/2020 05/23/2017  Decreased Interest 2 0  Down, Depressed, Hopeless 2 0  PHQ - 2 Score 4 0  Altered sleeping 2 -  Tired, decreased energy 3 -  Change in appetite 0 -  Feeling bad or failure about yourself  0 -  Trouble concentrating 3 -  Moving slowly or fidgety/restless 3 -  Suicidal thoughts 0 -  PHQ-9 Score 15 -  Difficult doing work/chores Somewhat difficult -   Reviewed allergies, medications, past medical, surgical, family, and social history.    Review of Systems Pertinent positives and negatives in the history of present illness.     Objective:   Physical Exam BP 120/70   Pulse 89   Temp 98.6 F (37 C)   Wt 218 lb 9.6 oz (99.2 kg)   BMI 31.64 kg/m   Alert and in no distress.  Neck is supple without adenopathy or thyromegaly. Cardiac exam shows a regular sinus rhythm without murmurs or gallops. Lungs are clear to auscultation.       Assessment & Plan:  Depression, unspecified depression type  Fatigue, unspecified type -  Plan: CBC with Differential/Platelet, Comprehensive metabolic panel, TSH, T4, free, T3, Iron, TIBC and Ferritin Panel, VITAMIN D 25 Hydroxy (Vit-D Deficiency, Fractures), Vitamin B12  Menorrhagia with regular cycle - Plan: CBC with Differential/Platelet, TSH, T4, free, T3, Iron, TIBC and Ferritin Panel  I congratulated her on taking the first step in treating her depression by coming in today.  She does not appear to be in any danger.  No thoughts of self-harm.  She is not self-medicating.  I provided her a list of counselors and she will call and schedule her appointment.  Discussed the possibility of starting on medication and we will hold off for now.  Discussed possible etiologies for fatigue and we will rule out anemia since she has been having heavy periods recently.  We will look for underlying physiological explanations for her and fatigue and follow-up pending lab results.  I also recommend a 4-week follow-up either virtually or in office

## 2020-03-02 NOTE — Patient Instructions (Signed)
You can call to schedule your appointment with the counselor. A few offices are listed below for you to call.  °  °[facility] °[address] °[address] °[place], [place] [address]  °Phone: [phone] ° °[facility] ° [address], [place], [place] [address]  °Phone: [phone] ° °[facility] °[address] [place] [address] °[place], [place] [address] [phone] for appointments  ° °[facility]  °[address] °[place], [place] [address] [phone] ° °[facility]  °[address]  °(across from [facility])  °[phone]  °  °[facility] °[address] °[place], [place] [address] [phone] °  ° °  °

## 2020-03-03 LAB — COMPREHENSIVE METABOLIC PANEL
ALT: 42 IU/L — ABNORMAL HIGH (ref 0–32)
AST: 29 IU/L (ref 0–40)
Albumin/Globulin Ratio: 1.7 (ref 1.2–2.2)
Albumin: 4.6 g/dL (ref 3.8–4.8)
Alkaline Phosphatase: 53 IU/L (ref 39–117)
BUN/Creatinine Ratio: 17 (ref 9–23)
BUN: 11 mg/dL (ref 6–20)
Bilirubin Total: 0.8 mg/dL (ref 0.0–1.2)
CO2: 20 mmol/L (ref 20–29)
Calcium: 9.6 mg/dL (ref 8.7–10.2)
Chloride: 103 mmol/L (ref 96–106)
Creatinine, Ser: 0.64 mg/dL (ref 0.57–1.00)
GFR calc Af Amer: 136 mL/min/{1.73_m2} (ref >59)
GFR calc non Af Amer: 118 mL/min/{1.73_m2} (ref >59)
Globulin, Total: 2.7 g/dL (ref 1.5–4.5)
Glucose: 100 mg/dL — ABNORMAL HIGH (ref 65–99)
Potassium: 4.6 mmol/L (ref 3.5–5.2)
Sodium: 136 mmol/L (ref 134–144)
Total Protein: 7.3 g/dL (ref 6.0–8.5)

## 2020-03-03 LAB — CBC WITH DIFFERENTIAL/PLATELET
Basophils Absolute: 0 10*3/uL (ref 0.0–0.2)
Basos: 0 %
EOS (ABSOLUTE): 0.1 10*3/uL (ref 0.0–0.4)
Eos: 1 %
Hematocrit: 40.5 % (ref 34.0–46.6)
Hemoglobin: 13.6 g/dL (ref 11.1–15.9)
Immature Grans (Abs): 0 10*3/uL (ref 0.0–0.1)
Immature Granulocytes: 0 %
Lymphocytes Absolute: 1.8 10*3/uL (ref 0.7–3.1)
Lymphs: 33 %
MCH: 28.7 pg (ref 26.6–33.0)
MCHC: 33.6 g/dL (ref 31.5–35.7)
MCV: 85 fL (ref 79–97)
Monocytes Absolute: 0.4 10*3/uL (ref 0.1–0.9)
Monocytes: 8 %
Neutrophils Absolute: 3.1 10*3/uL (ref 1.4–7.0)
Neutrophils: 58 %
Platelets: 275 10*3/uL (ref 150–450)
RBC: 4.74 x10E6/uL (ref 3.77–5.28)
RDW: 13 % (ref 11.7–15.4)
WBC: 5.4 10*3/uL (ref 3.4–10.8)

## 2020-03-03 LAB — IRON,TIBC AND FERRITIN PANEL
Ferritin: 35 ng/mL (ref 15–150)
Iron Saturation: 21 % (ref 15–55)
Iron: 77 ug/dL (ref 27–159)
Total Iron Binding Capacity: 367 ug/dL (ref 250–450)
UIBC: 290 ug/dL (ref 131–425)

## 2020-03-03 LAB — VITAMIN D 25 HYDROXY (VIT D DEFICIENCY, FRACTURES): Vit D, 25-Hydroxy: 27.6 ng/mL — ABNORMAL LOW (ref 30.0–100.0)

## 2020-03-03 LAB — TSH: TSH: 0.252 u[IU]/mL — ABNORMAL LOW (ref 0.450–4.500)

## 2020-03-03 LAB — T4, FREE: Free T4: 1.24 ng/dL (ref 0.82–1.77)

## 2020-03-03 LAB — T3: T3, Total: 122 ng/dL (ref 71–180)

## 2020-03-03 LAB — VITAMIN B12: Vitamin B-12: 614 pg/mL (ref 232–1245)

## 2020-03-03 NOTE — Progress Notes (Signed)
Discussed results via telephone. Please ask Dawn Barajas to add an acute hepatitis panel due to elevated ALT

## 2020-03-07 LAB — HEPATITIS PANEL, ACUTE
Hep A IgM: NEGATIVE
Hep B C IgM: NEGATIVE
Hep C Virus Ab: <0.1 s/co ratio (ref 0.0–0.9)
Hepatitis B Surface Ag: NEGATIVE

## 2020-03-07 LAB — SPECIMEN STATUS REPORT

## 2020-03-30 ENCOUNTER — Ambulatory Visit: Payer: BC Managed Care – PPO | Admitting: Family Medicine

## 2020-04-18 ENCOUNTER — Other Ambulatory Visit: Payer: Self-pay

## 2020-04-18 ENCOUNTER — Encounter: Payer: Self-pay | Admitting: Family Medicine

## 2020-04-18 ENCOUNTER — Ambulatory Visit: Payer: BC Managed Care – PPO | Admitting: Family Medicine

## 2020-04-18 VITALS — BP 120/80 | HR 80 | Wt 219.8 lb

## 2020-04-18 DIAGNOSIS — R7989 Other specified abnormal findings of blood chemistry: Secondary | ICD-10-CM | POA: Diagnosis not present

## 2020-04-18 DIAGNOSIS — F329 Major depressive disorder, single episode, unspecified: Secondary | ICD-10-CM | POA: Diagnosis not present

## 2020-04-18 DIAGNOSIS — R7401 Elevation of levels of liver transaminase levels: Secondary | ICD-10-CM | POA: Diagnosis not present

## 2020-04-18 DIAGNOSIS — F32A Depression, unspecified: Secondary | ICD-10-CM

## 2020-04-18 MED ORDER — ESCITALOPRAM OXALATE 10 MG PO TABS: 10.0000 mg | ORAL_TABLET | Freq: Every day | ORAL | 1 refills | Status: DC

## 2020-04-18 NOTE — Patient Instructions (Signed)
Take 1/2 tablet (5 mg) of Lexapro for the first week. If you are doing well, increase to the full tablet after that.  Take this in the evening.   Follow up in 2 weeks virtually.

## 2020-04-18 NOTE — Progress Notes (Signed)
   Subjective:    Patient ID: Dawn Barajas, female    DOB: Apr 30, 1986, 34 y.o.   MRN: [id number]  HPI Chief Complaint  Patient presents with  . 4 week follow-up    4 week follow-up. doing better since last visit but still working through stuff   She is here for follow-up on depression, elevated ALT and low TSH.  She has started counseling.  States she is going weekly but has not been in a couple of weeks.  States she thinks the counseling sessions are helping. She is ready to start on medication.  She denies any regular alcohol or NSAID use.  She would like to lose weight.  Denies fever, chills, dizziness, chest pain, palpitations, shortness of breath, abdominal pain, N/V/D.     Review of Systems Pertinent positives and negatives in the history of present illness.     Objective:   Physical Exam BP 120/80   Pulse 80   Wt 219 lb 12.8 oz (99.7 kg)   BMI 31.81 kg/m   Alert and oriented in no distress.  Not otherwise examined.      Assessment & Plan:  Depression, unspecified depression type - Plan: escitalopram (LEXAPRO) 10 MG tablet  Elevated ALT measurement - Plan: Comprehensive metabolic panel  Low TSH level - Plan: TSH, T4, free, T3  She has started counseling and I recommend that she continue going.  We will start her on low-dose Lexapro.  She will start with 1/2 tablet the first week.  Discussed potential side effects.  She will follow-up with me virtually in 2 weeks or sooner if she has any questions or concerns. Reviewed lab results including elevated ALT with negative acute hepatitis panel.  I will recheck liver functions today. I will also recheck thyroid panel. Counseling on intermittent fasting, restricting calories by using my fitness pal app to track her calories. She is not fasting, she ate a fruit cobbler at 10 AM, 4 hours prior to labs.

## 2020-04-19 LAB — T4, FREE: Free T4: 1.3 ng/dL (ref 0.82–1.77)

## 2020-04-19 LAB — T3: T3, Total: 131 ng/dL (ref 71–180)

## 2020-04-19 LAB — COMPREHENSIVE METABOLIC PANEL
ALT: 21 IU/L (ref 0–32)
AST: 21 IU/L (ref 0–40)
Albumin/Globulin Ratio: 1.6 (ref 1.2–2.2)
Albumin: 4.4 g/dL (ref 3.8–4.8)
Alkaline Phosphatase: 52 IU/L (ref 48–121)
BUN/Creatinine Ratio: 12 (ref 9–23)
BUN: 7 mg/dL (ref 6–20)
Bilirubin Total: 0.6 mg/dL (ref 0.0–1.2)
CO2: 22 mmol/L (ref 20–29)
Calcium: 9.3 mg/dL (ref 8.7–10.2)
Chloride: 98 mmol/L (ref 96–106)
Creatinine, Ser: 0.6 mg/dL (ref 0.57–1.00)
GFR calc Af Amer: 139 mL/min/{1.73_m2} (ref >59)
GFR calc non Af Amer: 120 mL/min/{1.73_m2} (ref >59)
Globulin, Total: 2.8 g/dL (ref 1.5–4.5)
Glucose: 85 mg/dL (ref 65–99)
Potassium: 4.6 mmol/L (ref 3.5–5.2)
Sodium: 138 mmol/L (ref 134–144)
Total Protein: 7.2 g/dL (ref 6.0–8.5)

## 2020-04-19 LAB — TSH: TSH: 0.255 u[IU]/mL — ABNORMAL LOW (ref 0.450–4.500)

## 2020-05-03 ENCOUNTER — Encounter: Payer: Self-pay | Admitting: Family Medicine

## 2020-05-03 ENCOUNTER — Other Ambulatory Visit: Payer: Self-pay

## 2020-05-03 ENCOUNTER — Telehealth: Payer: BC Managed Care – PPO | Admitting: Family Medicine

## 2020-05-03 DIAGNOSIS — F329 Major depressive disorder, single episode, unspecified: Secondary | ICD-10-CM

## 2020-05-03 DIAGNOSIS — F32A Depression, unspecified: Secondary | ICD-10-CM

## 2020-05-03 NOTE — Progress Notes (Signed)
   Subjective:  Documentation for virtual audio telecommunications through [facility] encounter. Video did not work.   The patient was located at home. 2 patient identifiers used.  The provider was located in the office. The patient did consent to this visit and is aware of possible charges through their insurance for this visit.  The other persons participating in this telemedicine service were none. Time spent on call was 12 minutes and in review of previous records 1 minutes total.  This virtual service is not related to other E/M service within previous 7 days.   Patient ID: Dawn Barajas, female    DOB: [birth year], 34 y.o.   MRN: [id number]  HPI Chief Complaint  Patient presents with  . 2 week follow-up    2 week follow-up, doing better with depression   This is a 2 week follow up on depression and starting on Lexapro.  States he had some mild nausea and this resolved.  States she is doing well on the medication.  Sleeping better.  She [place] the beach with [place] on vacation.  She did not see her counselor this week.    Review of Systems Pertinent positives and negatives in the history of present illness.     Objective:   Physical Exam There were no vitals taken for this visit.  Alert and oriented in no acute distress.  Normal mood, speech and thought process.      Assessment & Plan:  Depression, unspecified depression type  She seems to be doing well on new medication, Lexapro.  She had mild side effects initially which have resolved.  Discussed staying on the medication for at least the next 3 months.  I will provide refills when she needs them.  She will also continue counseling.  She will follow-up as needed

## 2020-06-07 ENCOUNTER — Other Ambulatory Visit: Payer: Self-pay | Admitting: Family Medicine

## 2020-06-07 DIAGNOSIS — F32A Depression, unspecified: Secondary | ICD-10-CM

## 2020-11-25 DIAGNOSIS — E0590 Thyrotoxicosis, unspecified without thyrotoxic crisis or storm: Secondary | ICD-10-CM

## 2020-11-25 HISTORY — DX: Thyrotoxicosis, unspecified without thyrotoxic crisis or storm: E05.90

## 2020-12-03 ENCOUNTER — Other Ambulatory Visit: Payer: Self-pay | Admitting: Family Medicine

## 2020-12-03 DIAGNOSIS — F32A Depression, unspecified: Secondary | ICD-10-CM

## 2020-12-04 NOTE — Telephone Encounter (Signed)
Left message for pt to call me back 

## 2021-01-03 ENCOUNTER — Encounter: Payer: Self-pay | Admitting: Family Medicine

## 2021-01-03 ENCOUNTER — Telehealth: Payer: BC Managed Care – PPO | Admitting: Family Medicine

## 2021-01-03 ENCOUNTER — Other Ambulatory Visit: Payer: Self-pay

## 2021-01-03 VITALS — Temp 98.8°F | Wt 220.0 lb

## 2021-01-03 DIAGNOSIS — H1031 Unspecified acute conjunctivitis, right eye: Secondary | ICD-10-CM

## 2021-01-03 MED ORDER — ERYTHROMYCIN 5 MG/GM OP OINT: 1.0000 "application " | TOPICAL_OINTMENT | Freq: Two times a day (BID) | OPHTHALMIC | 0 refills | Status: DC

## 2021-01-03 NOTE — Progress Notes (Signed)
   Subjective:  Documentation for virtual audio and video telecommunications through Caregility encounter:  The patient was located at home. 2 patient identifiers used.  The provider was located in the office. The patient did consent to this visit and is aware of possible charges through their insurance for this visit.  The other persons participating in this telemedicine service were none. Time spent on call was 12 minutes and in review of previous records 16 minutes total.  This virtual service is not related to other E/M service within previous 7 days.   Patient ID: Dawn Barajas, female    DOB: January 21, 1986, 35 y.o.   MRN: [id number]  HPI Chief Complaint  Patient presents with  . possible pink eye    Possible pink eye- yesterday, slightly red right eye, irritated, crusted, drainage, closed shut this morning    Complains of a 24 hour history of right eye with redness, irritation, greenish- yellow drainage. States it was matted together this morning.    Denies fever, chills, headache, vision changes, eye pain or foreign body.  She works at [employer] in [employer] center.  Has a 61 month old.    Has IUD now. Dr. Dollene Primrose put in her mirena.   Review of Systems Pertinent positives and negatives in the history of present illness.     Objective:   Physical Exam Temp 98.8 F (37.1 C)   Wt 220 lb (99.8 kg)   BMI 31.84 kg/m   Alert and oriented in no acute distress.  Right eye with redness.  Normal EOMs without pain.  Denies foreign body sensation      Assessment & Plan:  Acute conjunctivitis of right eye, unspecified acute conjunctivitis type - Plan: erythromycin ophthalmic ointment  No red flag symptoms. Advised that I will send in erythromycin ointment and discussed how to properly administer this.  Discussed the contagious nature.  Use good hygiene.  She will throw away any eye make-up and replace it. Follow-up if worsening

## 2021-01-17 ENCOUNTER — Telehealth: Payer: Self-pay

## 2021-01-17 NOTE — Telephone Encounter (Signed)
Pt. Just wanted to call to inform you that she tested positive for covid on 01/14/21

## 2021-01-17 NOTE — Telephone Encounter (Signed)
Does she want a virtual visit with me? If not, that is fine.

## 2021-01-17 NOTE — Telephone Encounter (Signed)
Pt. Did not request virtual at this time but will call back if needed.

## 2021-02-13 ENCOUNTER — Ambulatory Visit: Payer: BC Managed Care – PPO | Admitting: Family Medicine

## 2021-02-13 ENCOUNTER — Other Ambulatory Visit: Payer: Self-pay

## 2021-02-13 ENCOUNTER — Encounter: Payer: Self-pay | Admitting: Family Medicine

## 2021-02-13 VITALS — BP 110/70 | HR 82 | Wt 226.6 lb

## 2021-02-13 DIAGNOSIS — Z808 Family history of malignant neoplasm of other organs or systems: Secondary | ICD-10-CM

## 2021-02-13 DIAGNOSIS — R5383 Other fatigue: Secondary | ICD-10-CM

## 2021-02-13 DIAGNOSIS — R7989 Other specified abnormal findings of blood chemistry: Secondary | ICD-10-CM

## 2021-02-13 DIAGNOSIS — F32A Depression, unspecified: Secondary | ICD-10-CM

## 2021-02-13 MED ORDER — ESCITALOPRAM OXALATE 10 MG PO TABS: 10.0000 mg | ORAL_TABLET | Freq: Every day | ORAL | 1 refills | Status: DC

## 2021-02-13 NOTE — Progress Notes (Signed)
   Subjective:    Patient ID: Dawn Barajas, female    DOB: [birth year], 35 y.o.   MRN: [id number]  HPI Chief Complaint  Patient presents with  . follow-up     Follow-up on depression   She is here to follow up on depression.  Reports taking Lexapro and doing well with it.  States she has missed a day occasionally in can tell the difference.  She would like to continue on the medication.  Reports having Covid the end of February and still thinks she has lingering fatigue from her illness.  States she has been feeling very tired recently.  She also reports significant stress with her job and home.  Hoping to find a new job.  IUD in October   [facility] [place] at [place] summer or fall 2021 and states she had blood work done there along with BRCA testing.  States everything checked out fine.  She has had low TSH needs follow-up.  Denies fever, chills, dizziness, chest pain, palpitations, shortness of breath, abdominal pain, N/V/D,  LE edema.    Depression screen [facility] 2/9 02/13/2021 05/03/2020 04/18/2020 03/02/2020 05/23/2017  Decreased Interest 0 0 1 2 0  Down, Depressed, Hopeless 0 0 0 2 0  PHQ - 2 Score 0 0 1 4 0  Altered sleeping - - - 2 -  Tired, decreased energy - - - 3 -  Change in appetite - - - 0 -  Feeling bad or failure about yourself  - - - 0 -  Trouble concentrating - - - 3 -  Moving slowly or fidgety/restless - - - 3 -  Suicidal thoughts - - - 0 -  PHQ-9 Score - - - 15 -  Difficult doing work/chores - - - Somewhat difficult -      Review of Systems Pertinent positives and negatives in the history of present illness.     Objective:   Physical Exam BP 110/70   Pulse 82   Wt 226 lb 9.6 oz (102.8 kg)   BMI 32.79 kg/m   Alert and in no distress. Neck is supple without adenopathy or thyromegaly, no palpable nodules.  Respirations unlabored.  Normal speech, mood and thought process.      Assessment & Plan:  Depression, unspecified depression type - Plan:  escitalopram (LEXAPRO) 10 MG tablet  Low TSH level - Plan: TSH, T4, free, T3  Family history of thyroid cancer - Plan: TSH, T4, free, T3  Fatigue, unspecified type - Plan: CBC with Differential/Platelet, Comprehensive metabolic panel, TSH, T4, free, T3  She is here to follow-up on depression and taking Lexapro.  She will continue on the medication as it does seem to be beneficial. Low TSH over the past 3 years.  Recheck today.  Discussed the potential for thyroid ultrasound if needed.  No palpable nodules. Discussed multiple etiologies for fatigue including stress and recent Covid illness.  Most likely multifactorial.  Encouraged her to take time for herself.

## 2021-02-14 LAB — CBC WITH DIFFERENTIAL/PLATELET
Basophils Absolute: 0 10*3/uL (ref 0.0–0.2)
Basos: 0 %
EOS (ABSOLUTE): 0.1 10*3/uL (ref 0.0–0.4)
Eos: 1 %
Hematocrit: 40.4 % (ref 34.0–46.6)
Hemoglobin: 14 g/dL (ref 11.1–15.9)
Immature Grans (Abs): 0 10*3/uL (ref 0.0–0.1)
Immature Granulocytes: 0 %
Lymphocytes Absolute: 1.6 10*3/uL (ref 0.7–3.1)
Lymphs: 28 %
MCH: 29.9 pg (ref 26.6–33.0)
MCHC: 34.7 g/dL (ref 31.5–35.7)
MCV: 86 fL (ref 79–97)
Monocytes Absolute: 0.5 10*3/uL (ref 0.1–0.9)
Monocytes: 8 %
Neutrophils Absolute: 3.6 10*3/uL (ref 1.4–7.0)
Neutrophils: 63 %
Platelets: 279 10*3/uL (ref 150–450)
RBC: 4.69 x10E6/uL (ref 3.77–5.28)
RDW: 12.6 % (ref 11.7–15.4)
WBC: 5.8 10*3/uL (ref 3.4–10.8)

## 2021-02-14 LAB — COMPREHENSIVE METABOLIC PANEL
ALT: 21 IU/L (ref 0–32)
AST: 21 IU/L (ref 0–40)
Albumin/Globulin Ratio: 1.9 (ref 1.2–2.2)
Albumin: 4.8 g/dL (ref 3.8–4.8)
Alkaline Phosphatase: 49 IU/L (ref 44–121)
BUN/Creatinine Ratio: 17 (ref 9–23)
BUN: 11 mg/dL (ref 6–20)
Bilirubin Total: 0.8 mg/dL (ref 0.0–1.2)
CO2: 18 mmol/L — ABNORMAL LOW (ref 20–29)
Calcium: 9.5 mg/dL (ref 8.7–10.2)
Chloride: 102 mmol/L (ref 96–106)
Creatinine, Ser: 0.63 mg/dL (ref 0.57–1.00)
Globulin, Total: 2.5 g/dL (ref 1.5–4.5)
Glucose: 100 mg/dL — ABNORMAL HIGH (ref 65–99)
Potassium: 5.1 mmol/L (ref 3.5–5.2)
Sodium: 139 mmol/L (ref 134–144)
Total Protein: 7.3 g/dL (ref 6.0–8.5)
eGFR: 119 mL/min/{1.73_m2} (ref >59)

## 2021-02-14 LAB — T3: T3, Total: 127 ng/dL (ref 71–180)

## 2021-02-14 LAB — T4, FREE: Free T4: 1.12 ng/dL (ref 0.82–1.77)

## 2021-02-14 LAB — TSH: TSH: 0.362 u[IU]/mL — ABNORMAL LOW (ref 0.450–4.500)

## 2021-02-19 NOTE — Progress Notes (Signed)
Please call her and let her know that I would like to refer her to an endocrinologist to make sure her low TSH is not an issue. They may want to do more testing. We were initially just keeping an eye on this but after more thought about it, I think it would be best to get their opinion. Thanks.

## 2021-02-21 ENCOUNTER — Other Ambulatory Visit: Payer: Self-pay | Admitting: Internal Medicine

## 2021-02-21 DIAGNOSIS — R7989 Other specified abnormal findings of blood chemistry: Secondary | ICD-10-CM

## 2021-02-23 ENCOUNTER — Telehealth: Payer: Self-pay | Admitting: Family Medicine

## 2021-02-23 NOTE — Telephone Encounter (Signed)
Records received from [facility] °

## 2021-03-02 ENCOUNTER — Encounter: Payer: Self-pay | Admitting: Internal Medicine

## 2021-04-11 ENCOUNTER — Encounter: Payer: Self-pay | Admitting: Endocrinology

## 2021-04-11 ENCOUNTER — Ambulatory Visit: Payer: BC Managed Care – PPO | Admitting: Endocrinology

## 2021-04-11 ENCOUNTER — Other Ambulatory Visit: Payer: Self-pay

## 2021-04-11 VITALS — BP 132/80 | HR 63 | Ht 69.0 in | Wt 227.1 lb

## 2021-04-11 DIAGNOSIS — R7989 Other specified abnormal findings of blood chemistry: Secondary | ICD-10-CM | POA: Diagnosis not present

## 2021-04-11 MED ORDER — METHIMAZOLE 5 MG PO TABS: 5.0000 mg | ORAL_TABLET | ORAL | 1 refills | Status: DC

## 2021-04-11 NOTE — Progress Notes (Signed)
Subjective:    Patient ID: Dawn Barajas, female    DOB: 03-25-1986, 35 y.o.   MRN: [id number]  HPI Pt is referred by Hetty Blend, NP, for hyperthyroidism.  Pt reports he was dx'ed with hyperthyroidism in 2019.  she has never been on therapy for this.  she has never had XRT to the anterior neck, or thyroid surgery.  she has never had thyroid imaging.  She is not considering a pregnancy.  she does not consume kelp or any other non-prescribed thyroid medication.  she has never been on amiodarone. She reports weight gain, heat intolerance, and sweating.  Depression is well-controlled.      Past Medical History:  Diagnosis Date  . Childhood asthma   . History of chicken pox   . Lumbar degenerative disc disease     Past Surgical History:  Procedure Laterality Date  . WISDOM TOOTH EXTRACTION      Social History   Socioeconomic History  . Marital status: Married    Spouse name: Not on file  . Number of children: Not on file  . Years of education: Not on file  . Highest education level: Not on file  Occupational History  . Not on file  Tobacco Use  . Smoking status: Never Smoker  . Smokeless tobacco: Never Used  Vaping Use  . Vaping Use: Never used  Substance and Sexual Activity  . Alcohol use: Yes    Comment: one a week  . Drug use: No  . Sexual activity: Not on file  Other Topics Concern  . Not on file  Social History Narrative  . Not on file   Social Determinants of Health   Financial Resource Strain: Not on file  Food Insecurity: Not on file  Transportation Needs: Not on file  Physical Activity: Not on file  Stress: Not on file  Social Connections: Not on file  Intimate Partner Violence: Not on file    Current Outpatient Medications on File Prior to Visit  Medication Sig Dispense Refill  . escitalopram (LEXAPRO) 10 MG tablet Take 1 tablet (10 mg total) by mouth daily. 90 tablet 1  . Multiple Vitamin (MULTIVITAMIN PO) Take by mouth.     No current  facility-administered medications on file prior to visit.    No Known Allergies  Family History  Problem Relation Age of Onset  . Diabetes Mother   . Thyroid cancer Mother   . Breast cancer Mother 68  . Colon cancer Mother 15  . Depression Mother   . Obesity Mother   . Hypertension Mother   . Skin cancer Father 41  . Pancreatic cancer Father 62  . Depression Sister   . Stroke Paternal Grandmother 92    BP 132/80   Pulse 63   Ht 5\' 9"  (1.753 m)   Wt 227 lb 2 oz (103 kg)   SpO2 98%   BMI 33.54 kg/m     Review of Systems denies palpitations, sob, and tremor.       Objective:   Physical Exam VS: see vs page GEN: no distress HEAD: head: no deformity eyes: no periorbital swelling, no proptosis external nose and ears are normal NECK: thyroid is slightly enlarged, with irreg surface (R>L) CHEST WALL: no deformity LUNGS: clear to auscultation CV: reg rate and rhythm, no murmur.  MUSCULOSKELETAL: gait is normal and steady EXTEMITIES: no deformity.  no leg edema NEURO:  readily moves all 4's.  sensation is intact to touch on all 4's SKIN:  Normal texture and temperature.  No rash or suspicious lesion is visible.   NODES:  None palpable at the neck.   PSYCH: alert, well-oriented.  Does not appear anxious nor depressed.     Lab Results  Component Value Date   TSH 0.362 (L) 02/13/2021   T3TOTAL 127 02/13/2021   I have reviewed outside records, and summarized: Pt was noted to have elevated A1c, and referred here.  Fatigue and depression were also addressed      Assessment & Plan:  Hyperthyroidism, new to me.  We discussed.  She requests a trial of rx, dut to sxs  Patient Instructions  I have sent a prescription to your pharmacy, to slow the thyroid.   If ever you have fever while taking this, stop it and call us, even if the reason is obvious, because of the risk of a rare side-effect. It is best to never miss the medication.  However, if you do miss it, next best  is to double up the next time.   Please come back for a follow-up appointment in 2 months.

## 2021-04-11 NOTE — Patient Instructions (Signed)
I have sent a prescription to your pharmacy, to slow the thyroid.   If ever you have fever while taking this, stop it and call us, even if the reason is obvious, because of the risk of a rare side-effect. It is best to never miss the medication.  However, if you do miss it, next best is to double up the next time.   Please come back for a follow-up appointment in 2 months.

## 2021-04-18 ENCOUNTER — Encounter: Payer: Self-pay | Admitting: Family Medicine

## 2021-04-18 ENCOUNTER — Other Ambulatory Visit: Payer: Self-pay

## 2021-04-18 ENCOUNTER — Ambulatory Visit: Payer: BC Managed Care – PPO | Admitting: Family Medicine

## 2021-04-18 VITALS — BP 120/68 | HR 68 | Temp 98.6°F | Wt 228.6 lb

## 2021-04-18 DIAGNOSIS — L309 Dermatitis, unspecified: Secondary | ICD-10-CM | POA: Diagnosis not present

## 2021-04-18 MED ORDER — TRIAMCINOLONE ACETONIDE 0.1 % EX CREA: 1.0000 "application " | TOPICAL_CREAM | Freq: Two times a day (BID) | CUTANEOUS | 0 refills | Status: DC

## 2021-04-18 NOTE — Progress Notes (Signed)
   Subjective:    Patient ID: Dawn Barajas, female    DOB: 1986-01-09, 35 y.o.   MRN: [id number]  HPI Chief Complaint  Patient presents with  . rash    Rash behind the neck and ear for about a week   Complains of a one week history of a rash on the back of her neck. Rash is pruritic and burns at times. No new soap, shampoo, lotion, detergent or anything else she can recall. She is sleeping well without medication. Has not been using anything for her rash and it is worsening.  Denies fever, chills, headache, arthralgias, myalgias, abdominal pain, N/V/D.   Reviewed allergies, medications, past medical, surgical, family, and social history.   Review of Systems Pertinent positives and negatives in the history of present illness.     Objective:   Physical Exam Constitutional:      General: She is not in acute distress.    Appearance: She is not ill-appearing.  Neck:      Comments: Raised, red, pruritic rash above neck line and below hair line. No sign of infection.  Pulmonary:     Effort: Pulmonary effort is normal.  Musculoskeletal:     Cervical back: Normal range of motion and neck supple.  Skin:    General: Skin is warm and dry.     Findings: Rash present.  Neurological:     Mental Status: She is alert.    BP 120/68   Pulse 68   Temp 98.6 F (37 C)   Wt 228 lb 9.6 oz (103.7 kg)   SpO2 99%   BMI 33.76 kg/m         Assessment & Plan:  Dermatitis - Plan: triamcinolone cream (KENALOG) 0.1 %  Unclear etiology. No sign of infection and advised to avoid scratching. Use cool compresses.  Discussed treatment is same whether contact vs irritant vs allergic. She will try the steroid cream, oral antihistamine including Benadryl at bedtime and let me know if the area is worsening or not improving over the next few days.

## 2021-04-18 NOTE — Patient Instructions (Signed)
Rash, Adult  A rash is a change in the color of your skin. A rash can also change the way your skin feels. There are many different conditions and factors that can cause a rash. Follow these instructions at home: The goal of treatment is to stop the itching and keep the rash from spreading. Watch for any changes in your symptoms. Let your doctor know about them. Follow these instructions to help with your condition: Medicine Take or apply over-the-counter and prescription medicines only as told by your doctor. These may include medicines:  To treat red or swollen skin (corticosteroid creams).  To treat itching.  To treat an allergy (oral antihistamines).  To treat very bad symptoms (oral corticosteroids).   Skin care  Put cool cloths (compresses) on the affected areas.  Do not scratch or rub your skin.  Avoid covering the rash. Make sure that the rash is exposed to air as much as possible. Managing itching and discomfort  Avoid hot showers or baths. These can make itching worse. A cold shower may help.  Try taking a bath with: ? Epsom salts. You can get these at your local pharmacy or grocery store. Follow the instructions on the package. ? Baking soda. Pour a small amount into the bath as told by your doctor. ? Colloidal oatmeal. You can get this at your local pharmacy or grocery store. Follow the instructions on the package.  Try putting baking soda paste onto your skin. Stir water into baking soda until it gets like a paste.  Try putting on a lotion that relieves itchiness (calamine lotion).  Keep cool and out of the sun. Sweating and being hot can make itching worse. General instructions  Rest as needed.  Drink enough fluid to keep your pee (urine) pale yellow.  Wear loose-fitting clothing.  Avoid scented soaps, detergents, and perfumes. Use gentle soaps, detergents, perfumes, and other cosmetic products.  Avoid anything that causes your rash. Keep a journal to help  track what causes your rash. Write down: ? What you eat. ? What cosmetic products you use. ? What you drink. ? What you wear. This includes jewelry.  Keep all follow-up visits as told by your doctor. This is important.   Contact a doctor if:  You sweat at night.  You lose weight.  You pee (urinate) more than normal.  You pee less than normal, or you notice that your pee is a darker color than normal.  You feel weak.  You throw up (vomit).  Your skin or the whites of your eyes look yellow (jaundice).  Your skin: ? Tingles. ? Is numb.  Your rash: ? Does not go away after a few days. ? Gets worse.  You are: ? More thirsty than normal. ? More tired than normal.  You have: ? New symptoms. ? Pain in your belly (abdomen). ? A fever. ? Watery poop (diarrhea). Get help right away if:  You have a fever and your symptoms suddenly get worse.  You start to feel mixed up (confused).  You have a very bad headache or a stiff neck.  You have very bad joint pains or stiffness.  You have jerky movements that you cannot control (seizure).  Your rash covers all or most of your body. The rash may or may not be painful.  You have blisters that: ? Are on top of the rash. ? Grow larger. ? Grow together. ? Are painful. ? Are inside your nose or mouth.  You have   a rash that: ? Looks like purple pinprick-sized spots all over your body. ? Has a "bull's eye" or looks like a target. ? Is red and painful, causes your skin to peel, and is not from being in the sun too long. Summary  A rash is a change in the color of your skin. A rash can also change the way your skin feels.  The goal of treatment is to stop the itching and keep the rash from spreading.  Take or apply over-the-counter and prescription medicines only as told by your doctor.  Contact a doctor if you have new symptoms or symptoms that get worse.  Keep all follow-up visits as told by your doctor. This is  important. This information is not intended to replace advice given to you by your health care provider. Make sure you discuss any questions you have with your health care provider. Document Revised: 03/05/2019 Document Reviewed: 06/15/2018 Elsevier Patient Education  2021 Elsevier Inc.  

## 2021-05-08 ENCOUNTER — Telehealth: Payer: Self-pay | Admitting: Endocrinology

## 2021-05-08 NOTE — Telephone Encounter (Signed)
Patient called and requested office manager. Had an initial visit with our office 04/11/21 and has F/U scheduled for 06/15/21  Requesting a call back from office manager to [phone]

## 2021-05-08 NOTE — Telephone Encounter (Signed)
Spoke with patient and she would like to transfer care to another provider in the office.

## 2021-05-30 ENCOUNTER — Telehealth: Payer: Self-pay | Admitting: Family Medicine

## 2021-05-30 NOTE — Telephone Encounter (Signed)
Dawn Barajas reaching out to speak with [name]   OK to call back this afternoon [phone] (available until 3pm - ok to leave message and she will call back)

## 2021-05-30 NOTE — Telephone Encounter (Signed)
Pt called

## 2021-05-31 ENCOUNTER — Ambulatory Visit: Payer: BC Managed Care – PPO | Admitting: Medical

## 2021-05-31 ENCOUNTER — Other Ambulatory Visit: Payer: Self-pay

## 2021-05-31 VITALS — BP 120/70 | HR 62 | Temp 97.7°F | Wt 230.4 lb

## 2021-05-31 DIAGNOSIS — E559 Vitamin D deficiency, unspecified: Secondary | ICD-10-CM | POA: Insufficient documentation

## 2021-05-31 DIAGNOSIS — R21 Rash and other nonspecific skin eruption: Secondary | ICD-10-CM | POA: Diagnosis not present

## 2021-05-31 NOTE — Progress Notes (Signed)
Subjective:  Dawn Barajas is a 35 y.o. female who presents for Chief Complaint  Patient presents with   skin issues    Skin issues getting worse, around neck, hair line and scalp. Itching.      Here for ongoing rash of neck and scalp and upper back.  Romeo Rabon NP here late May 2022 for skin issues, rash.   She wasn't sure at the time, gave her a gave her a cream, Triamcinolone to see if this helps.   The triamcinolone cream did not help and the rash has gotten progressively worse.  Rash includes upper back, posterior neck bilaterally, all within the scalp hairline area is scaly.  The rash tends to be itchy.  She denies any change in hygiene products or shampoo for years.  Is pretty consistent with the same products.  Does not wear a lot of make-up.  No other exposures.  This all started in May.  She has had low vitamin D and not currently taking vitamin D other than in the multivitamin  She used some coconut oil which helped a little bit.    Works at [employer], Teaching laboratory technician  No other aggravating or relieving factors.    No other c/o.  Past Medical History:  Diagnosis Date   Childhood asthma    History of chicken pox    Lumbar degenerative disc disease    Current Outpatient Medications on File Prior to Visit  Medication Sig Dispense Refill   escitalopram (LEXAPRO) 10 MG tablet Take 1 tablet (10 mg total) by mouth daily. 90 tablet 1   IUD'S IU by Intrauterine route.     Multiple Vitamin (MULTIVITAMIN PO) Take by mouth.     triamcinolone cream (KENALOG) 0.1 % Apply 1 application topically 2 (two) times daily. 30 g 0   methimazole (TAPAZOLE) 5 MG tablet Take 1 tablet (5 mg total) by mouth 3 (three) times a week. (Patient not taking: No sig reported) 50 tablet 1   No current facility-administered medications on file prior to visit.    The following portions of the patient's history were reviewed and updated as appropriate: allergies, current medications, past family  history, past medical history, past social history, past surgical history and problem list.  ROS Otherwise as in subjective above  Objective: BP 120/70   Pulse 62   Temp 97.7 F (36.5 C)   Wt 230 lb 6.4 oz (104.5 kg)   BMI 34.02 kg/m   General appearance: alert, no distress, well developed, well nourished Right posterior neck and upper back with several patches of pinkish coloration contiguous somewhat rough appearing but no papules, no vesicles, several smaller patches with similar appearance on the left neck posteriorly, throughout the hairline and scalp there is flaky somewhat scaly skin without erythema compared to the other rash on the neck   Assessment: Encounter Diagnoses  Name Primary?   Rash Yes   Vitamin D deficiency      Plan: We discussed the symptoms and findings.  We discussed possible differential which could include seborrheic dermatitis, psoriasis, contact dermatitis or other.  I sent an electronic E consult for specialty provider review through [facility] on their behalf regarding symptoms and findings above.  I will get back in touch with patient pending recommendations from the [facility] consult.  Darriel was seen today for skin issues.  Diagnoses and all orders for this visit:  Rash  Vitamin D deficiency   Follow up: pending call back

## 2021-06-01 ENCOUNTER — Encounter: Payer: Self-pay | Admitting: Medical

## 2021-06-01 ENCOUNTER — Other Ambulatory Visit: Payer: Self-pay | Admitting: Medical

## 2021-06-01 ENCOUNTER — Encounter: Payer: Self-pay | Admitting: Internal Medicine

## 2021-06-01 ENCOUNTER — Ambulatory Visit (INDEPENDENT_AMBULATORY_CARE_PROVIDER_SITE_OTHER): Payer: BC Managed Care – PPO | Admitting: Internal Medicine

## 2021-06-01 VITALS — BP 118/84 | HR 74 | Ht 69.0 in | Wt 228.0 lb

## 2021-06-01 DIAGNOSIS — E0590 Thyrotoxicosis, unspecified without thyrotoxic crisis or storm: Secondary | ICD-10-CM

## 2021-06-01 LAB — TSH: TSH: 0.28 u[IU]/mL — ABNORMAL LOW (ref 0.35–5.50)

## 2021-06-01 LAB — T4, FREE: Free T4: 0.85 ng/dL (ref 0.60–1.60)

## 2021-06-01 MED ORDER — DESONIDE 0.05 % EX OINT: 1.0000 "application " | TOPICAL_OINTMENT | Freq: Two times a day (BID) | CUTANEOUS | 0 refills | Status: DC

## 2021-06-01 MED ORDER — KETOCONAZOLE 2 % EX CREA: 1.0000 "application " | TOPICAL_CREAM | Freq: Two times a day (BID) | CUTANEOUS | 0 refills | Status: DC

## 2021-06-01 NOTE — Telephone Encounter (Signed)
Note sent to patient

## 2021-06-01 NOTE — Progress Notes (Signed)
Name: Caly Pellum  MRN/ DOB: [id number], December 05, 1985    Age/ Sex: 35 y.o., female     PCP: Avanell Shackleton, NP-C   Reason for Endocrinology Evaluation: Subclinical Hyperthyroidism     Initial Endocrinology Clinic Visit: 04/11/2021    PATIENT IDENTIFIER: Ms. Julliette Frentz is a 35 y.o., female with a past medical history of Depression . She has followed with [facility] since 04/11/2021 for consultative assistance with management of her Subclinical hyperthyroidism.   HISTORICAL SUMMARY: The patient was first diagnosed with subclinical hyperthyroidism in 2018 with a TSh nadir of 0.252 uIU/mL   She presented to Dr. Everardo All in 03/2021 and transition to my care by 05/2021  SUBJECTIVE:    Today (06/01/2021):  Ms. Mumpower is here for a follow up on subclinical Hyperthyroidism.  Dr. Everardo All has prescribed methimazole due to symptoms.    Denies weight loss  Denies palpitations, tremors  Has loose stools  Denies local neck swelling  No prior exposure to radiation   No biotin   Mother with thyroid cancer at age 15   LMP 05/29/2021 - regular  No plans to conceive    Has IUD   HISTORY:  Past Medical History:  Past Medical History:  Diagnosis Date   Childhood asthma    History of chicken pox    Lumbar degenerative disc disease    Past Surgical History:  Past Surgical History:  Procedure Laterality Date   WISDOM TOOTH EXTRACTION     Social History:  reports that she has never smoked. She has never used smokeless tobacco. She reports current alcohol use. She reports that she does not use drugs. Family History:  Family History  Problem Relation Age of Onset   Diabetes Mother    Thyroid cancer Mother    Breast cancer Mother 31   Colon cancer Mother 70   Depression Mother    Obesity Mother    Hypertension Mother    Skin cancer Father 57   Pancreatic cancer Father 47   Depression Sister    Stroke Paternal Grandmother 33     HOME MEDICATIONS: Allergies  as of 06/01/2021   No Known Allergies      Medication List        Accurate as of June 01, 2021  4:49 PM. If you have any questions, ask your nurse or doctor.          escitalopram 10 MG tablet Commonly known as: LEXAPRO Take 1 tablet (10 mg total) by mouth daily.   IUD'S IU by Intrauterine route.   methimazole 5 MG tablet Commonly known as: TAPAZOLE Take 1 tablet (5 mg total) by mouth 3 (three) times a week.   MULTIVITAMIN PO Take by mouth.   triamcinolone cream 0.1 % Commonly known as: KENALOG Apply 1 application topically 2 (two) times daily.          OBJECTIVE:   PHYSICAL EXAM: VS: BP 118/84   Pulse 74   Ht 5\' 9"  (1.753 m)   Wt 228 lb (103.4 kg)   SpO2 99%   BMI 33.67 kg/m    EXAM: General: Pt appears well and is in NAD  Eyes: External eye exam normal without stare, lid lag or exophthalmos.  EOM intact.  PERRL.  Neck: General: Supple without adenopathy. Thyroid: Right neck fullness noted.  No thyroid bruit.  Lungs: Clear with good BS bilat with no rales, rhonchi, or wheezes  Heart: Auscultation: RRR.  Abdomen: Normoactive bowel sounds, soft, nontender, without masses  or organomegaly palpable  Extremities:  BL LE: No pretibial edema normal ROM and strength.  Skin: Hair: Texture and amount normal with gender appropriate distribution Skin Inspection: No rashes Skin Palpation: Skin temperature, texture, and thickness normal to palpation  Mental Status: Judgment, insight: Intact Orientation: Oriented to time, place, and person Mood and affect: No depression, anxiety, or agitation     DATA REVIEWED: Results for REYA, AURICH (MRN [id number]) as of 06/01/2021 16:58  Ref. Range 04/18/2020 14:20 02/13/2021 10:29 06/01/2021 09:53  TSH Latest Ref Range: 0.35 - 5.50 uIU/mL 0.255 (L) 0.362 (L) 0.28 (L)  Triiodothyronine (T3) Latest Ref Range: 71 - 180 ng/dL 761 607   P7,TGGY(IRSWNI) Latest Ref Range: 0.60 - 1.60 ng/dL 6.27 0.35 0.09    TR AB-  pending  ASSESSMENT / PLAN / RECOMMENDATIONS:   Subclinical hyperthyroidism:   -Patient is clinically euthyroid -We discussed differential diagnosis of Graves' disease, autonomous thyroid nodule(s) , I doubt that this is a normal variant as this is not common in her ethnic group but it is certainly a possibility. -Patient is at high risk for developing autoimmune thyroid disease given mother with history of thyroid cancer -I am going to check her TR AB -We will also proceed with thyroid ultrasound given right neck fullness on exam today -We also discussed thyroid uptake and scan if needed -She understands that at this time there is no urgency to start her on any thionamides therapy given slightly low TSH and no symptoms   Follow up in 4 months   Signed electronically by: Lyndle Herrlich, MD  [facility] Endocrinology  [facility] [address] [place] [place], [place] [address] Phone: [phone] FAX: [phone]      CC: Avanell Shackleton, NP-C [address] [place] [address] Phone: [phone]  Fax: [phone]   Return to Endocrinology clinic as below: Future Appointments  Date Time Provider Department Center  08/22/2021  9:30 AM Avanell Shackleton, NP-C [facility]  10/03/2021  8:50 AM Jonpaul Lumm, Konrad Dolores, MD [facility] None

## 2021-06-03 ENCOUNTER — Encounter: Payer: Self-pay | Admitting: Internal Medicine

## 2021-06-04 LAB — TRAB (TSH RECEPTOR BINDING ANTIBODY): TRAB: <1 IU/L (ref <=2.00)

## 2021-06-04 LAB — T3: T3, Total: 111 ng/dL (ref 76–181)

## 2021-06-13 ENCOUNTER — Ambulatory Visit
Admission: RE | Admit: 2021-06-13 | Discharge: 2021-06-13 | Disposition: A | Discharge status: Home / Self Care | Payer: BC Managed Care – PPO | Source: Ambulatory Visit | Attending: Internal Medicine | Admitting: Internal Medicine

## 2021-06-13 DIAGNOSIS — E0590 Thyrotoxicosis, unspecified without thyrotoxic crisis or storm: Secondary | ICD-10-CM

## 2021-06-14 ENCOUNTER — Telehealth: Payer: Self-pay | Admitting: Internal Medicine

## 2021-06-14 DIAGNOSIS — E0590 Thyrotoxicosis, unspecified without thyrotoxic crisis or storm: Secondary | ICD-10-CM

## 2021-06-14 MED ORDER — METHIMAZOLE 5 MG PO TABS: 2.5000 mg | ORAL_TABLET | Freq: Every day | ORAL | 2 refills | Status: DC

## 2021-06-14 NOTE — Telephone Encounter (Signed)
done

## 2021-06-14 NOTE — Telephone Encounter (Signed)
I have discussed thyroid ultrasound results with the patient on 06/14/2021 at 1245   I have recommended holding off on uptake and scan since the ultrasound does not show any thyroid nodules  I do have high suspicion for Graves' disease, despite a negative TR AB   I have recommended treatment with methimazole which she is in agreement of this    Stop methimazole 5 mg, half a tablet daily  She will have labs on August 15 at 8 AM    Abby Raelyn Mora, MD  [facility] Endocrinology  [facility] [address] [place], [place] [address] Phone: [phone] FAX: [phone]

## 2021-06-15 ENCOUNTER — Ambulatory Visit: Payer: BC Managed Care – PPO | Admitting: Endocrinology

## 2021-06-20 ENCOUNTER — Other Ambulatory Visit: Payer: Self-pay | Admitting: Medical

## 2021-06-20 MED ORDER — KETOCONAZOLE 200 MG PO TABS: 200.0000 mg | ORAL_TABLET | Freq: Every day | ORAL | 0 refills | Status: DC

## 2021-06-20 MED ORDER — KETOCONAZOLE 2 % EX CREA: 1.0000 "application " | TOPICAL_CREAM | Freq: Two times a day (BID) | CUTANEOUS | 0 refills | Status: DC

## 2021-07-07 ENCOUNTER — Encounter: Payer: Self-pay | Admitting: Internal Medicine

## 2021-07-09 ENCOUNTER — Other Ambulatory Visit (INDEPENDENT_AMBULATORY_CARE_PROVIDER_SITE_OTHER): Payer: 59

## 2021-07-09 ENCOUNTER — Encounter: Payer: Self-pay | Admitting: Internal Medicine

## 2021-07-09 ENCOUNTER — Ambulatory Visit (INDEPENDENT_AMBULATORY_CARE_PROVIDER_SITE_OTHER): Payer: 59 | Admitting: Internal Medicine

## 2021-07-09 ENCOUNTER — Other Ambulatory Visit: Payer: Self-pay

## 2021-07-09 VITALS — BP 114/74 | HR 79 | Wt 230.2 lb

## 2021-07-09 DIAGNOSIS — E0590 Thyrotoxicosis, unspecified without thyrotoxic crisis or storm: Secondary | ICD-10-CM | POA: Diagnosis not present

## 2021-07-09 DIAGNOSIS — R21 Rash and other nonspecific skin eruption: Secondary | ICD-10-CM

## 2021-07-09 LAB — TSH: TSH: 0.14 u[IU]/mL — ABNORMAL LOW (ref 0.35–5.50)

## 2021-07-09 LAB — T4, FREE: Free T4: 0.88 ng/dL (ref 0.60–1.60)

## 2021-07-09 NOTE — Telephone Encounter (Signed)
Called and spoke with patient She is come in on today

## 2021-07-09 NOTE — Progress Notes (Signed)
Name: Dawn Barajas  MRN/ DOB: [id number], May 25, 1986    Age/ Sex: 35 y.o., female     PCP: Avanell Shackleton, NP-C   Reason for Endocrinology Evaluation: Subclinical hyperthyroidism     Initial Endocrinology Clinic Visit: 04/11/2021    PATIENT IDENTIFIER: Dawn Barajas is a 35 y.o., female with a past medical history of depression. She has followed with [facility] since 04/11/2021 for consultative assistance with management of her subclinical hyperthyroidism.   HISTORICAL SUMMARY: The patient was first diagnosed with subclinical hyperthyroidism in 2018 with a TSh nadir of 0.252 uIU/mL    She presented to Dr. Everardo All in 03/2021 and transition to my care by 05/2021  TRAb negative  Thyroid ultrasound 05/2021 Mildly enlarged and mildly heterogeneous thyroid gland  She was started on Methimazole 05/2021 but developed a rash    Mother with thyroid cancer at age 68   SUBJECTIVE:    Today (07/09/2021):  Dawn Barajas is here for subclinical hyperthyroidism .   Developed a rash while on Methimazole  Weight stable   She has been treated for seborrheic dermatitis prior to starting methimazole, requiring topical as well as oral ketoconazole  Has IUD   HISTORY:  Past Medical History:  Past Medical History:  Diagnosis Date   Childhood asthma    History of chicken pox    Lumbar degenerative disc disease    Past Surgical History:  Past Surgical History:  Procedure Laterality Date   WISDOM TOOTH EXTRACTION     Social History:  reports that she has never smoked. She has never used smokeless tobacco. She reports current alcohol use. She reports that she does not use drugs. Family History:  Family History  Problem Relation Age of Onset   Diabetes Mother    Thyroid cancer Mother    Breast cancer Mother 55   Colon cancer Mother 79   Depression Mother    Obesity Mother    Hypertension Mother    Skin cancer Father 53   Pancreatic cancer Father 70   Depression  Sister    Stroke Paternal Grandmother 37     HOME MEDICATIONS: Allergies as of 07/09/2021   No Known Allergies      Medication List        Accurate as of July 09, 2021  3:59 PM. If you have any questions, ask your nurse or doctor.          desonide 0.05 % ointment Commonly known as: DESOWEN Apply 1 application topically 2 (two) times daily.   escitalopram 10 MG tablet Commonly known as: LEXAPRO Take 1 tablet (10 mg total) by mouth daily.   IUD'S IU by Intrauterine route.   ketoconazole 2 % cream Commonly known as: NIZORAL Apply 1 application topically 2 (two) times daily.   ketoconazole 200 MG tablet Commonly known as: NIZORAL Take 1 tablet (200 mg total) by mouth daily.   methimazole 5 MG tablet Commonly known as: TAPAZOLE Take 0.5 tablets (2.5 mg total) by mouth daily.   MULTIVITAMIN PO Take by mouth.   triamcinolone cream 0.1 % Commonly known as: KENALOG Apply 1 application topically 2 (two) times daily.          OBJECTIVE:   PHYSICAL EXAM: VS: BP 114/74   Pulse 79   Wt 230 lb 3.2 oz (104.4 kg)   SpO2 98%   BMI 33.99 kg/m    EXAM: General: Pt appears well and is in NAD  Neck: General: Supple without adenopathy. Thyroid: Thyroid  size normal.  No goiter or nodules appreciated.  Lungs: Clear with good BS bilat with no rales, rhonchi, or wheezes  Heart: Auscultation: RRR.  Abdomen: Normoactive bowel sounds, soft, nontender, without masses or organomegaly palpable  Extremities:  BL LE: No pretibial edema normal ROM and strength.  Skin: Patient has erythematous macular rash over the lateral neck bilaterally, worse on the left, she also has patches of dried and flaky skin.  Patient also has evidence of seborrheic dermatitis over the scalp area  Mental Status: Judgment, insight: Intact Orientation: Oriented to time, place, and person Mood and affect: No depression, anxiety, or agitation     DATA REVIEWED:  Results for ROANNE, HAYE  (MRN [id number]) as of 07/09/2021 17:33  Ref. 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  Skin rash:   -Discussed differential diagnosis to include allergic reaction to methimazole, versus extension of seborrheic dermatitis, versus cutaneous lupus. -She was advised to stop methimazole   -She is going to use Claritin daily in the morning, Benadryl at night, and calamine lotion as needed through the day  -She will see her PCP tomorrow and I would recommend a dermatology evaluation   Follow-up in 3 months  Signed electronically by: Lyndle Herrlich, MD  [facility] Endocrinology  [facility] [address] [place] [place], [place] [address] Phone: [phone] FAX: [phone]      CC: Avanell Shackleton, NP-C [address] [address] Phone: [phone]  Fax: [phone]   Return to Endocrinology clinic as below: Future Appointments  Date Time Provider Department Center  07/09/2021  4:00 PM Destinie Thornsberry, Konrad Dolores, MD [facility] None  07/10/2021 11:30 AM Jac Canavan, PA-C [facility]  08/22/2021  9:30 AM  Avanell Shackleton, NP-C [facility]  10/03/2021  8:50 AM Meggen Spaziani, Konrad Dolores, MD [facility] None

## 2021-07-09 NOTE — Patient Instructions (Signed)
-   STOP Methimazole  - START Claritin every morning  - May use benadryl at night  - Use calamine lotion as needed     [facility] Address: [address] [place], [place] [address] Phone: [phone]

## 2021-07-10 ENCOUNTER — Ambulatory Visit: Payer: 59 | Admitting: Medical

## 2021-07-10 VITALS — BP 122/80 | HR 73 | Wt 230.4 lb

## 2021-07-10 DIAGNOSIS — E0590 Thyrotoxicosis, unspecified without thyrotoxic crisis or storm: Secondary | ICD-10-CM

## 2021-07-10 DIAGNOSIS — L219 Seborrheic dermatitis, unspecified: Secondary | ICD-10-CM

## 2021-07-10 DIAGNOSIS — E559 Vitamin D deficiency, unspecified: Secondary | ICD-10-CM

## 2021-07-10 DIAGNOSIS — R21 Rash and other nonspecific skin eruption: Secondary | ICD-10-CM

## 2021-07-10 MED ORDER — KETOCONAZOLE 2 % EX SHAM
1.0000 | MEDICATED_SHAMPOO | CUTANEOUS | 0 refills | Status: DC
Start: 2021-07-12 — End: 2021-08-22

## 2021-07-10 NOTE — Progress Notes (Signed)
Subjective:  Dawn Barajas is a 35 y.o. female who presents for Chief Complaint  Patient presents with   1 month follow-up    1 month follow-up on skin rash. Gotten worse and needs something different. Would like a referral to derm     Here for recheck of skin issues.  Last visit just a little over a month ago we discussed skin rash on her scalp neck and upper back.  We had done a virtual consult with [facility] dermatology.  They had given recommendations to treat her seborrheic dermatosis.  She has been noticed for a solid month and has had really no change at all.  She is also tried some [employer] over-the-counter twice weekly.  She switched up to aloe gel 1 evening after some worse redness and that just made everything burn.  Her endocrinologist recently stopped her methimazole in the event this was causing a reaction but this has not made any change after stopping the methimazole  At this point she is very frustrated with lack of improvement.  She has resorted to using coconut oil and continues on the [facility] currently.  She saw Vickie NP here late May 2022 for skin issues, rash.     No other aggravating or relieving factors.    No other c/o.  Past Medical History:  Diagnosis Date   Childhood asthma    History of chicken pox    Lumbar degenerative disc disease    Current Outpatient Medications on File Prior to Visit  Medication Sig Dispense Refill   escitalopram (LEXAPRO) 10 MG tablet Take 1 tablet (10 mg total) by mouth daily. 90 tablet 1   IUD'S IU by Intrauterine route.     Multiple Vitamin (MULTIVITAMIN PO) Take by mouth.     No current facility-administered medications on file prior to visit.    The following portions of the patient's history were reviewed and updated as appropriate: allergies, current medications, past family history, past medical history, past social history, past surgical history and problem list.  ROS Otherwise as in subjective  above  Objective: BP 122/80   Pulse 73   Wt 230 lb 6.4 oz (104.5 kg)   BMI 34.02 kg/m   General appearance: alert, no distress, well developed, well nourished Right posterior neck and upper back with several patches of pinkish coloration contiguous somewhat rough appearing but no papules, no vesicles, several smaller patches with similar appearance on the left neck posteriorly, throughout the hairline and scalp there is flaky somewhat scaly skin without erythema compared to the other rash on the neck   Assessment: Encounter Diagnoses  Name Primary?   Seborrheic dermatitis Yes   Subclinical hyperthyroidism    Vitamin D deficiency    Rash       Plan: At this point she has failed multiple therapies including oral antifungal, topical antifungal, topical steroid, Selsun Blue shampoo.  We will make a referral to dermatology.  In the meantime we will begin trial of Nizoral shampoo and continue desonide topical cream  Continue vitamin D supplement  [name] was seen today for 1 month follow-up.  Diagnoses and all orders for this visit:  Seborrheic dermatitis -     Ambulatory referral to Dermatology  Subclinical hyperthyroidism  Vitamin D deficiency  Rash -     Ambulatory referral to Dermatology  Other orders -     ketoconazole (NIZORAL) 2 % shampoo; Apply 1 application topically 2 (two) times a week.   Follow up: pending referral

## 2021-07-17 LAB — HM PAP SMEAR

## 2021-07-17 LAB — RESULTS CONSOLE HPV: CHL HPV: NEGATIVE

## 2021-07-19 LAB — LIPID PANEL
Cholesterol: 215 — AB (ref 0–200)
HDL: 36 (ref 35–70)
LDL Cholesterol: 155
Triglycerides: 120 (ref 40–160)

## 2021-07-19 LAB — BASIC METABOLIC PANEL: Glucose: 79

## 2021-07-19 LAB — HEMOGLOBIN A1C: Hemoglobin A1C: 5.7

## 2021-08-21 NOTE — Progress Notes (Signed)
Subjective:    Patient ID: Dawn Barajas, female    DOB: 1986-03-19, 35 y.o.   MRN: [id number]  HPI Chief Complaint  Patient presents with   Annual Exam   She is here for a complete physical exam.  Other providers: OB/GYN Endocrinologist  She is on Lexapro for depression and it is working well for her.     Social history: Lives with spouse and children, works at [employer]  Denies smoking, drinking alcohol, drug use  Diet: fairly healthy  Excerise: nothing lately but active at work   Immunizations: flu shot today   Health maintenance:   Mammogram: N/A Colonoscopy: N/A Last Gynecological Exam: in the last month  Last Dental Exam: overdue  Last Eye Exam: June 2022  Wears seatbelt always, uses sunscreen, smoke detectors in home and functioning, does not text while driving and feels safe in home environment.   Reviewed allergies, medications, past medical, surgical, family, and social history.    Review of Systems Review of Systems Constitutional: -fever, -chills, -sweats, -unexpected weight change,-fatigue ENT: -runny nose, -ear pain, -sore throat Cardiology:  -chest pain, -palpitations, -edema Respiratory: -cough, -shortness of breath, -wheezing Gastroenterology: -abdominal pain, -nausea, -vomiting, -diarrhea, -constipation  Hematology: -bleeding or bruising problems Musculoskeletal: -arthralgias, -myalgias, -joint swelling, -back pain Ophthalmology: -vision changes Urology: -dysuria, -difficulty urinating, -hematuria, -urinary frequency, -urgency Neurology: -headache, -weakness, -tingling, -numbness       Objective:   Physical Exam BP 138/88   Pulse 67   Ht 5\' 9"  (1.753 m)   Wt 229 lb (103.9 kg)   SpO2 99%   BMI 33.82 kg/m   General Appearance:    Alert, cooperative, no distress, appears stated age  Head:    Normocephalic, without obvious abnormality, atraumatic  Eyes:    PERRL, conjunctiva/corneas clear, EOM's intact   Ears:     Normal TM's and external ear canals  Nose:   Mask on   Throat:   Mask on   Neck:   Supple, no lymphadenopathy;  thyroid:  no   enlargement/tenderness/nodules; no JVD  Back:    Spine nontender, no curvature, ROM normal, no CVA     tenderness  Lungs:     Clear to auscultation bilaterally without wheezes, rales or     ronchi; respirations unlabored  Chest Wall:    No tenderness or deformity   Heart:    Regular rate and rhythm, S1 and S2 normal, no murmur, rub   or gallop  Breast Exam:    OB/GYN  Abdomen:     Soft, non-tender, nondistended, normoactive bowel sounds,    no masses, no hepatosplenomegaly  Genitalia:   OB/gYN     Extremities:   No clubbing, cyanosis or edema  Pulses:   2+ and symmetric all extremities  Skin:   Skin color, texture, turgor normal, no rashes or lesions  Lymph nodes:   Cervical, supraclavicular, and axillary nodes normal  Neurologic:   CNII-XII intact, normal strength, sensation and gait          Psych:   Normal mood, affect, hygiene and grooming.          Assessment & Plan:  Routine general medical examination at a health care facility  Depression, unspecified depression type - Plan: escitalopram (LEXAPRO) 10 MG tablet  Mixed hyperlipidemia  Needs flu shot  For CPE.  Declines labs.  Recent labs done with her endocrinologist and also through her employer.  She will forward me her lipid panel results. Preventive health  care reviewed.  She sees her OB/GYN.  Counseling on healthy lifestyle including diet and exercise.  Recommend regular dental and eye exams.  Immunizations reviewed.  Flu shot given today.  She would like to hold off on her COVID booster until before her trip to [place] in December.  She is in good spirits.  Lexapro is working well for her.  She can tell if she does not take it.  Refills provided. I will review with the panel results once she sends them to me.  Discussed risk factors for heart disease.

## 2021-08-21 NOTE — Patient Instructions (Signed)
Preventive Care 21-35 Years Old, Female Preventive care refers to lifestyle choices and visits with your health care provider that can promote health and wellness. This includes: A yearly physical exam. This is also called an annual wellness visit. Regular dental and eye exams. Immunizations. Screening for certain conditions. Healthy lifestyle choices, such as: Eating a healthy diet. Getting regular exercise. Not using drugs or products that contain nicotine and tobacco. Limiting alcohol use. What can I expect for my preventive care visit? Physical exam Your health care provider may check your: Height and weight. These may be used to calculate your BMI (body mass index). BMI is a measurement that tells if you are at a healthy weight. Heart rate and blood pressure. Body temperature. Skin for abnormal spots. Counseling Your health care provider may ask you questions about your: Past medical problems. Family's medical history. Alcohol, tobacco, and drug use. Emotional well-being. Home life and relationship well-being. Sexual activity. Diet, exercise, and sleep habits. Work and work environment. Access to firearms. Method of birth control. Menstrual cycle. Pregnancy history. What immunizations do I need? Vaccines are usually given at various ages, according to a schedule. Your health care provider will recommend vaccines for you based on your age, medical history, and lifestyle or other factors, such as travel or where you work. What tests do I need? Blood tests Lipid and cholesterol levels. These may be checked every 5 years starting at age 20. Hepatitis C test. Hepatitis B test. Screening Diabetes screening. This is done by checking your blood sugar (glucose) after you have not eaten for a while (fasting). STD (sexually transmitted disease) testing, if you are at risk. BRCA-related cancer screening. This may be done if you have a family history of breast, ovarian, tubal, or  peritoneal cancers. Pelvic exam and Pap test. This may be done every 3 years starting at age 21. Starting at age 30, this may be done every 5 years if you have a Pap test in combination with an HPV test. Talk with your health care provider about your test results, treatment options, and if necessary, the need for more tests. Follow these instructions at home: Eating and drinking  Eat a healthy diet that includes fresh fruits and vegetables, whole grains, lean protein, and low-fat dairy products. Take vitamin and mineral supplements as recommended by your health care provider. Do not drink alcohol if: Your health care provider tells you not to drink. You are pregnant, may be pregnant, or are planning to become pregnant. If you drink alcohol: Limit how much you have to 0-1 drink a day. Be aware of how much alcohol is in your drink. In the [place] one drink equals one 12 oz bottle of beer (355 mL), one 5 oz glass of wine (148 mL), or one 1 oz glass of hard liquor (44 mL). Lifestyle Take daily care of your teeth and gums. Brush your teeth every morning and night with fluoride toothpaste. Floss one time each day. Stay active. Exercise for at least 30 minutes 5 or more days each week. Do not use any products that contain nicotine or tobacco, such as cigarettes, e-cigarettes, and chewing tobacco. If you need help quitting, ask your health care provider. Do not use drugs. If you are sexually active, practice safe sex. Use a condom or other form of protection to prevent STIs (sexually transmitted infections). If you do not wish to become pregnant, use a form of birth control. If you plan to become pregnant, see your health care provider   for a prepregnancy visit. Find healthy ways to cope with stress, such as: Meditation, yoga, or listening to music. Journaling. Talking to a trusted person. Spending time with friends and family. Safety Always wear your seat belt while driving or riding in a  vehicle. Do not drive: If you have been drinking alcohol. Do not ride with someone who has been drinking. When you are tired or distracted. While texting. Wear a helmet and other protective equipment during sports activities. If you have firearms in your house, make sure you follow all gun safety procedures. Seek help if you have been physically or sexually abused. What's next? Go to your health care provider once a year for an annual wellness visit. Ask your health care provider how often you should have your eyes and teeth checked. Stay up to date on all vaccines. This information is not intended to replace advice given to you by your health care provider. Make sure you discuss any questions you have with your health care provider. Document Revised: 01/19/2021 Document Reviewed: 07/23/2018 Elsevier Patient Education  2022 Elsevier Inc.  

## 2021-08-22 ENCOUNTER — Other Ambulatory Visit: Payer: Self-pay

## 2021-08-22 ENCOUNTER — Encounter: Payer: Self-pay | Admitting: Family Medicine

## 2021-08-22 ENCOUNTER — Ambulatory Visit: Payer: 59 | Admitting: Family Medicine

## 2021-08-22 VITALS — BP 138/88 | HR 67 | Ht 69.0 in | Wt 229.0 lb

## 2021-08-22 DIAGNOSIS — Z0000 Encounter for general adult medical examination without abnormal findings: Secondary | ICD-10-CM | POA: Diagnosis not present

## 2021-08-22 DIAGNOSIS — Z23 Encounter for immunization: Secondary | ICD-10-CM | POA: Diagnosis not present

## 2021-08-22 DIAGNOSIS — F32A Depression, unspecified: Secondary | ICD-10-CM | POA: Diagnosis not present

## 2021-08-22 DIAGNOSIS — E782 Mixed hyperlipidemia: Secondary | ICD-10-CM

## 2021-08-22 MED ORDER — ESCITALOPRAM OXALATE 10 MG PO TABS: 10.0000 mg | ORAL_TABLET | Freq: Every day | ORAL | 1 refills | Status: DC

## 2021-08-22 NOTE — Addendum Note (Signed)
Addended by: Fulton Reek A on: 08/22/2021 10:37 AM   Modules accepted: Orders

## 2021-08-23 NOTE — Progress Notes (Signed)
Abstracted labs from [facility]

## 2021-10-03 ENCOUNTER — Other Ambulatory Visit: Payer: Self-pay

## 2021-10-03 ENCOUNTER — Encounter: Payer: Self-pay | Admitting: Internal Medicine

## 2021-10-03 ENCOUNTER — Ambulatory Visit: Payer: 59 | Admitting: Internal Medicine

## 2021-10-03 VITALS — BP 122/74 | HR 70 | Ht 69.0 in | Wt 222.0 lb

## 2021-10-03 DIAGNOSIS — E0590 Thyrotoxicosis, unspecified without thyrotoxic crisis or storm: Secondary | ICD-10-CM | POA: Diagnosis not present

## 2021-10-03 LAB — T3: T3, Total: 122 ng/dL (ref 76–181)

## 2021-10-03 LAB — TSH: TSH: 0.3 u[IU]/mL — ABNORMAL LOW (ref 0.35–5.50)

## 2021-10-03 LAB — T4, FREE: Free T4: 0.91 ng/dL (ref 0.60–1.60)

## 2021-10-03 MED ORDER — METHIMAZOLE 5 MG PO TABS: 5.0000 mg | ORAL_TABLET | Freq: Every day | ORAL | 6 refills | Status: DC

## 2021-10-03 NOTE — Progress Notes (Signed)
Name: Dawn Barajas  MRN/ DOB: [id number], September 11, 1986    Age/ Sex: 35 y.o., female     PCP: Ishmael Holter   Reason for Endocrinology Evaluation: Subclinical hyperthyroidism     Initial Endocrinology Clinic Visit: 04/11/2021    PATIENT IDENTIFIER: Dawn Barajas is a 35 y.o., female with a past medical history of depression. She has followed with [facility] since 04/11/2021 for consultative assistance with management of her subclinical hyperthyroidism.   HISTORICAL SUMMARY: The patient was first diagnosed with subclinical hyperthyroidism in 2018 with a TSh nadir of 0.252 uIU/mL    She presented to Dr. Everardo All in 03/2021 and transition to my care by 05/2021  TRAb negative  Thyroid ultrasound 05/2021 Mildly enlarged and mildly heterogeneous thyroid gland  She was started on Methimazole 05/2021 but developed a rash and was stopped by 06/2021   Mother with thyroid cancer at age 69   SUBJECTIVE:    Today (10/03/2021):  Dawn Barajas is here for subclinical hyperthyroidism .    She has been off Methimazole since 06/2021 due to rash . Saw dermatology and this was attributed to hair products  as well as eczema . She still has a few patches of rash on neck and left antecubital fossa  She opted not to proceed with allergy testing at this time   Weight has decreased  Has loose stools  Denies tremors  Denies palpitations  Denies local neck swelling  She does endorse fatigue and irritability     HISTORY:  Past Medical History:  Past Medical History:  Diagnosis Date   Childhood asthma    History of chicken pox    Lumbar degenerative disc disease    Past Surgical History:  Past Surgical History:  Procedure Laterality Date   WISDOM TOOTH EXTRACTION     Social History:  reports that she has never smoked. She has never used smokeless tobacco. She reports current alcohol use. She reports that she does not use drugs. Family History:  Family History   Problem Relation Age of Onset   Diabetes Mother    Thyroid cancer Mother    Breast cancer Mother 76   Colon cancer Mother 31   Depression Mother    Obesity Mother    Hypertension Mother    Skin cancer Father 51   Pancreatic cancer Father 17   Depression Sister    Stroke Paternal Grandmother 18     HOME MEDICATIONS: Allergies as of 10/03/2021   No Known Allergies      Medication List        Accurate as of October 03, 2021  9:08 AM. If you have any questions, ask your nurse or doctor.          cholecalciferol 25 MCG (1000 UNIT) tablet Commonly known as: VITAMIN D3 Take 1,000 Units by mouth daily.   escitalopram 10 MG tablet Commonly known as: LEXAPRO Take 1 tablet (10 mg total) by mouth daily.   IUD'S IU by Intrauterine route.   MULTIVITAMIN PO Take by mouth.          OBJECTIVE:   PHYSICAL EXAM: VS: BP 122/74 (BP Location: Left Arm, Patient Position: Sitting, Cuff Size: Small)   Pulse 70   Ht 5\' 9"  (1.753 m)   Wt 222 lb (100.7 kg)   SpO2 99%   BMI 32.78 kg/m    EXAM: General: Pt appears well and is in NAD  Neck: General: Supple without adenopathy. Thyroid: Thyroid size normal.  No  goiter or nodules appreciated.  Lungs: Clear with good BS bilat with no rales, rhonchi, or wheezes  Heart: Auscultation: RRR.  Abdomen: Normoactive bowel sounds, soft, nontender, without masses or organomegaly palpable  Extremities:  BL LE: No pretibial edema normal ROM and strength.  Skin: Patient has erythematous macular rash over the lateral neck bilaterally, worse on the left, she also has patches of dried and flaky skin.  Patient also has evidence of seborrheic dermatitis over the scalp area  Mental Status: Judgment, insight: Intact Orientation: Oriented to time, place, and person Mood and affect: No depression, anxiety, or agitation     DATA REVIEWED:  Results for Dawn Barajas, Dawn Barajas (MRN [id number]) as of 07/09/2021 17:33  Ref. Range 07/09/2021 09:57  TSH  Latest Ref Range: 0.35 - 5.50 uIU/mL 0.14 (L)  T4,Free(Direct) Latest Ref Range: 0.60 - 1.60 ng/dL 9.52      ASSESSMENT / PLAN / RECOMMENDATIONS:   Subclinical hyperthyroid:   -Patient with fatigue and irritability  - Clinical scenario consistent with Graves'  -TRAb has come back negative -Thyroid ultrasound does not show any evidence of nodules, but I 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 based on age and ultrasound imaging of the thyroid gland with enlarged size and heterogeneity -The reason for treatment is due to chronicity of low TSH from 2018 - She developed a rash while on Methimazole which we stopped but the rash has been attributed to hair products and have resolved, she is interested in re challenging with methimazole    Medications   Restart Methimazole 5 mg daily    Follow-up in 4 months  Signed electronically by: Lyndle Herrlich, MD  [facility]  [facility] [address] [place], [place] [address] Phone: [phone] FAX: [phone]      CC: Avanell Shackleton, PA-C No address on file Phone: None  Fax: None   Return to Endocrinology clinic as below: No future appointments.

## 2021-10-04 ENCOUNTER — Encounter: Payer: Self-pay | Admitting: Internal Medicine

## 2021-11-25 DIAGNOSIS — Z975 Presence of (intrauterine) contraceptive device: Secondary | ICD-10-CM

## 2021-11-25 DIAGNOSIS — G4733 Obstructive sleep apnea (adult) (pediatric): Secondary | ICD-10-CM

## 2021-11-25 HISTORY — DX: Presence of (intrauterine) contraceptive device: Z97.5

## 2021-11-25 HISTORY — DX: Obstructive sleep apnea (adult) (pediatric): G47.33

## 2021-12-14 ENCOUNTER — Other Ambulatory Visit (INDEPENDENT_AMBULATORY_CARE_PROVIDER_SITE_OTHER): Payer: 59

## 2021-12-14 ENCOUNTER — Other Ambulatory Visit: Payer: Self-pay

## 2021-12-14 DIAGNOSIS — E0590 Thyrotoxicosis, unspecified without thyrotoxic crisis or storm: Secondary | ICD-10-CM | POA: Diagnosis not present

## 2021-12-14 LAB — T4, FREE: Free T4: 0.72 ng/dL (ref 0.60–1.60)

## 2021-12-14 LAB — TSH: TSH: 0.56 u[IU]/mL (ref 0.35–5.50)

## 2022-02-06 ENCOUNTER — Ambulatory Visit: Payer: 59 | Admitting: Internal Medicine

## 2022-02-11 ENCOUNTER — Encounter: Payer: 59 | Admitting: Physician Assistant

## 2022-02-14 ENCOUNTER — Encounter: Payer: Self-pay | Admitting: Physician Assistant

## 2022-02-14 ENCOUNTER — Other Ambulatory Visit: Payer: Self-pay

## 2022-02-14 ENCOUNTER — Ambulatory Visit: Payer: 59 | Admitting: Physician Assistant

## 2022-02-14 VITALS — BP 132/78 | HR 73 | Ht 69.0 in | Wt 226.0 lb

## 2022-02-14 DIAGNOSIS — F330 Major depressive disorder, recurrent, mild: Secondary | ICD-10-CM | POA: Diagnosis not present

## 2022-02-14 DIAGNOSIS — R0683 Snoring: Secondary | ICD-10-CM

## 2022-02-14 MED ORDER — ESCITALOPRAM OXALATE 10 MG PO TABS: 10.0000 mg | ORAL_TABLET | Freq: Every day | ORAL | 1 refills | Status: DC

## 2022-02-14 NOTE — Progress Notes (Signed)
? ?Established Patient Office Visit ? ?Subjective:  ?Patient ID: Dawn Barajas, female    DOB: May 22, 1986  Age: 36 y.o. MRN: [id number] ? ?CC:  ?Chief Complaint  ?Patient presents with  ? Medication Refill  ?  Medcheck for Lexapro. Pt also wants to get a referral for a sleep study.   ? ? ?HPI ?Dawn Barajas presents for a follow up appointment.  ? ?Patient presents for a follow up of depression; reports tolerating medicine well, taking medicine as prescribed, is eating and sleeping without difficulty; denies any abdominal pain, nausea or vomiting, diarrhea or constipation, or fatigue.  ? ?Also states her Wife tells her that her snoring is worse and has recorded her at night when she sometimes stops breathing; requests to be checked for sleep apnea. ? ?Depression screen:  See questionnaire below.  ? ?  02/14/2022  ?  3:55 PM 08/22/2021  ?  9:53 AM 04/18/2021  ? 11:19 AM 02/13/2021  ?  9:58 AM 05/03/2020  ?  9:34 AM  ?Depression screen PHQ 2/9  ?Decreased Interest 0 0 0 0 0  ?Down, Depressed, Hopeless 0 0 0 0 0  ?PHQ - 2 Score 0 0 0 0 0  ?  ?Outpatient Medications Prior to Visit  ?Medication Sig Dispense Refill  ? cholecalciferol (VITAMIN D3) 25 MCG (1000 UNIT) tablet Take 1,000 Units by mouth daily.    ? IUD'S IU by Intrauterine route.    ? methimazole (TAPAZOLE) 5 MG tablet Take 1 tablet (5 mg total) by mouth daily. 30 tablet 6  ? Multiple Vitamin (MULTIVITAMIN PO) Take by mouth.    ? escitalopram (LEXAPRO) 10 MG tablet Take 1 tablet (10 mg total) by mouth daily. 90 tablet 1  ? ?No facility-administered medications prior to visit.  ? ? ?No Known Allergies ? ?ROS ?Review of Systems  ?Constitutional:  Negative for activity change and chills.  ?HENT:  Negative for congestion and voice change.   ?Eyes:  Negative for pain and redness.  ?Respiratory:  Negative for cough and wheezing.   ?Cardiovascular:  Negative for chest pain.  ?Gastrointestinal:  Negative for constipation, diarrhea, nausea and vomiting.  ?Endocrine: Negative  for polyuria.  ?Genitourinary:  Negative for frequency.  ?Skin:  Negative for color change and rash.  ?Allergic/Immunologic: Negative for immunocompromised state.  ?Neurological:  Negative for dizziness.  ?Psychiatric/Behavioral:  Positive for sleep disturbance. Negative for agitation.   ? ?  ?Objective:  ?  ?Physical Exam ?Vitals and nursing note reviewed.  ?Constitutional:   ?   General: She is not in acute distress. ?   Appearance: She is normal weight. She is not ill-appearing.  ?HENT:  ?   Head: Normocephalic and atraumatic.  ?   Right Ear: External ear normal.  ?   Left Ear: External ear normal.  ?Eyes:  ?   Extraocular Movements: Extraocular movements intact.  ?   Conjunctiva/sclera: Conjunctivae normal.  ?   Pupils: Pupils are equal, round, and reactive to light.  ?Cardiovascular:  ?   Rate and Rhythm: Normal rate and regular rhythm.  ?Pulmonary:  ?   Effort: Pulmonary effort is normal.  ?   Breath sounds: Normal breath sounds. No wheezing.  ?Abdominal:  ?   General: Bowel sounds are normal.  ?   Palpations: Abdomen is soft.  ?Musculoskeletal:     ?   General: Normal range of motion.  ?   Cervical back: Normal range of motion.  ?Skin: ?   General: Skin is warm and  dry.  ?Neurological:  ?   Mental Status: She is alert and oriented to person, place, and time.  ?Psychiatric:     ?   Mood and Affect: Mood normal.  ? ? ?BP 132/78   Pulse 73   Wt 226 lb (102.5 kg)   LMP 01/23/2022   SpO2 97%   BMI 33.37 kg/m?  ? ?Wt Readings from Last 3 Encounters:  ?02/14/22 226 lb (102.5 kg)  ?10/03/21 222 lb (100.7 kg)  ?08/22/21 229 lb (103.9 kg)  ? ? ?Results for orders placed or performed in visit on 12/14/21  ?TSH  ?Result Value Ref Range  ? TSH 0.56 0.35 - 5.50 uIU/mL  ?T4, free  ?Result Value Ref Range  ? Free T4 0.72 0.60 - 1.60 ng/dL  ?  ? ? ? ?The ASCVD Risk score (Arnett DK, et al., 2019) failed to calculate for the following reasons: ?  The 2019 ASCVD risk score is only valid for ages 32 to 42 ? ?  ?Assessment &  Plan:  ? ?Problem List Items Addressed This Visit   ? ?  ? Other  ? Depression, major, recurrent, mild (HCC) - Primary  ? Relevant Medications  ? escitalopram (LEXAPRO) 10 MG tablet  ? ?Other Visit Diagnoses   ? ? Snoring      ? Relevant Orders  ? Ambulatory referral to Sleep Studies  ? ?  ? ? ?Meds ordered this encounter  ?Medications  ? escitalopram (LEXAPRO) 10 MG tablet  ?  Sig: Take 1 tablet (10 mg total) by mouth daily.  ?  Dispense:  90 tablet  ?  Refill:  1  ?  Order Specific Question:   Supervising Provider  ?  Answer:   Ronnald Nian [[id number]]  ? ? ?Follow-up: Return in about 6 months (around 08/23/2022) for Return for Annual Exam.  ? ? ?Jake Shark, PA-C ?

## 2022-02-14 NOTE — Patient Instructions (Signed)
You will get a call to schedule an appointment with Sleep Medicine ? ?

## 2022-02-15 NOTE — Assessment & Plan Note (Signed)
Stable, continue Lexapro 10 mg daily. ?

## 2022-02-18 ENCOUNTER — Other Ambulatory Visit: Payer: Self-pay

## 2022-02-18 ENCOUNTER — Encounter: Payer: Self-pay | Admitting: Internal Medicine

## 2022-02-18 ENCOUNTER — Ambulatory Visit: Payer: 59 | Admitting: Internal Medicine

## 2022-02-18 VITALS — BP 122/72 | HR 72 | Ht 69.0 in | Wt 228.0 lb

## 2022-02-18 DIAGNOSIS — E0590 Thyrotoxicosis, unspecified without thyrotoxic crisis or storm: Secondary | ICD-10-CM | POA: Diagnosis not present

## 2022-02-18 NOTE — Progress Notes (Signed)
? ?Name: Dawn Barajas  ?MRN/ DOB: [id number], [birth year]    ?Age/ Sex: 36 y.o., female   ? ? ?PCP: Jake Shark, PA-C   ?Reason for Endocrinology Evaluation: Subclinical hyperthyroidism  ?   ?Initial Endocrinology Clinic Visit: 04/11/2021  ? ? ?PATIENT IDENTIFIER: Dawn Barajas is a 36 y.o., female with a past medical history of depression. She has followed with [facility] since 04/11/2021 for consultative assistance with management of her subclinical hyperthyroidism.  ? ?HISTORICAL SUMMARY: The patient was first diagnosed with subclinical hyperthyroidism in 2018 with a TSh nadir of 0.252 uIU/mL  ?  ?She presented to Dr. Everardo All in 03/2021 and transition to my care by 05/2021 ? ?TRAb negative  ?Thyroid ultrasound 05/2021 Mildly enlarged and mildly heterogeneous thyroid gland ? ?She was started on Methimazole 05/2021 but developed a rash (which was attributed to hair products and eczema ) and was stopped by 06/2021, was restarted on methimazole 09/2021 due to fatigue and irritability and a TSH of 0.14 u IU/mL ? ? ?Mother with thyroid cancer at age 75  ? ?SUBJECTIVE:  ? ? ?Today (02/18/2022):  Dawn Barajas is here for subclinical hyperthyroidism .  ? ? ?Weight has been gradually increasing  ?Rash has resolved with changing hair product line   ?Denies constipation or diarrhea  ?Denies tremors  ?Denies palpitations  ?Denies local neck swelling  ?Fatigue and irritability  have improved  ? ?Methimazole 5 mg daily ? ?HISTORY:  ?Past Medical History:  ?Past Medical History:  ?Diagnosis Date  ? Childhood asthma   ? History of chicken pox   ? Lumbar degenerative disc disease   ? ?Past Surgical History:  ?Past Surgical History:  ?Procedure Laterality Date  ? WISDOM TOOTH EXTRACTION    ? ?Social History:  reports that she has never smoked. She has never used smokeless tobacco. She reports current alcohol use. She reports that she does not use drugs. ?Family History:  ?Family History  ?Problem Relation Age of  Onset  ? Diabetes Mother   ? Thyroid cancer Mother   ? Breast cancer Mother 42  ? Colon cancer Mother 66  ? Depression Mother   ? Obesity Mother   ? Hypertension Mother   ? Skin cancer Father 39  ? Pancreatic cancer Father 66  ? Depression Sister   ? Stroke Paternal Grandmother 35  ? ? ? ?HOME MEDICATIONS: ?Allergies as of 02/18/2022   ?No Known Allergies ?  ? ?  ?Medication List  ?  ? ?  ? Accurate as of February 18, 2022  7:16 AM. If you have any questions, ask your nurse or doctor.  ?  ?  ? ?  ? ?cholecalciferol 25 MCG (1000 UNIT) tablet ?Commonly known as: VITAMIN D3 ?Take 1,000 Units by mouth daily. ?  ?escitalopram 10 MG tablet ?Commonly known as: LEXAPRO ?Take 1 tablet (10 mg total) by mouth daily. ?  ?IUD'S IU ?by Intrauterine route. ?  ?methimazole 5 MG tablet ?Commonly known as: TAPAZOLE ?Take 1 tablet (5 mg total) by mouth daily. ?  ?MULTIVITAMIN PO ?Take by mouth. ?  ? ?  ? ? ? ? ?OBJECTIVE:  ? ?PHYSICAL EXAM: ?VS: BP 122/72 (BP Location: Left Arm, Patient Position: Sitting)   Pulse 72   Ht 5\' 9"  (1.753 m)   Wt 228 lb (103.4 kg)   LMP 01/23/2022   SpO2 99%   BMI 33.67 kg/m?  ? ? ?EXAM: ?General: Pt appears well and is in NAD  ?Neck: General: Supple  without adenopathy. ?Thyroid: Thyroid size normal.  No goiter or nodules appreciated.  ?Lungs: Clear with good BS bilat with no rales, rhonchi, or wheezes  ?Heart: Auscultation: RRR.  ?Abdomen: Normoactive bowel sounds, soft, nontender, without masses or organomegaly palpable  ?Extremities:  ?BL LE: No pretibial edema normal ROM and strength.  ?Skin: Patient has erythematous macular rash over the lateral neck bilaterally, worse on the left, she also has patches of dried and flaky skin. ? ?Patient also has evidence of seborrheic dermatitis over the scalp area  ?Mental Status: Judgment, insight: Intact ?Orientation: Oriented to time, place, and person ?Mood and affect: No depression, anxiety, or agitation  ? ? ? ?DATA REVIEWED: ? ? Latest Reference Range &  Units 02/18/22 08:34  ?TSH 0.35 - 5.50 uIU/mL 0.74  ?T4,Free(Direct) 0.60 - 1.60 ng/dL 2.24  ? ? ? ? ?ASSESSMENT / PLAN / RECOMMENDATIONS:  ? ?Subclinical hyperthyroid: ? ? ?-Pt is clinically euthyroid  ?- Clinical scenario consistent with Graves'  ?-TRAb has come back negative ?-Thyroid ultrasound does not show any evidence of nodules, but I 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 based on age and ultrasound imaging of the thyroid gland with enlarged size and heterogeneity ?-The reason for treatment is due to chronicity of low TSH since  2018 ?- TFT' normal, no change  ?Medications  ? ?Continue Methimazole 5 mg daily  ? ? ?Follow-up in 4 months ? ?Signed electronically by: ?Abby Raelyn Mora, MD ? ?[facility]  ?[facility] ?[address] ?[place], [place] [address] ?Phone: [phone] ?FAX: [phone]  ? ? ? ? ?CC: ?Lexine Baton ?[address] ?[place] [address] ?Phone: [phone]  ?Fax: [phone] ? ? ?Return to Endocrinology clinic as below: ?Future Appointments  ?Date Time Provider Department Center  ?02/18/2022  8:10 AM Franklyn Cafaro, Konrad Dolores, MD [facility] None  ?08/30/2022  3:00 PM Jake Shark, PA-C [facility]  ? ?  ? ?

## 2022-02-19 LAB — TSH: TSH: 0.74 u[IU]/mL (ref 0.35–5.50)

## 2022-02-19 LAB — T4, FREE: Free T4: 0.7 ng/dL (ref 0.60–1.60)

## 2022-02-19 MED ORDER — METHIMAZOLE 5 MG PO TABS: 5.0000 mg | ORAL_TABLET | Freq: Every day | ORAL | 2 refills | Status: DC

## 2022-03-02 IMAGING — US US THYROID
1 series · 14 of 25 positions shown · non-contrast
Comparison: None.

CLINICAL DATA: 35-year-old female with thyroid fullness,
subclinical hypothyroidism.

EXAM:
THYROID ULTRASOUND
TECHNIQUE: Ultrasound examination of the thyroid gland and adjacent soft
tissues was performed.

[Series 1: us thyroid · 0.08mm/px · 14 of 36 slices shown]
[im 1/36]
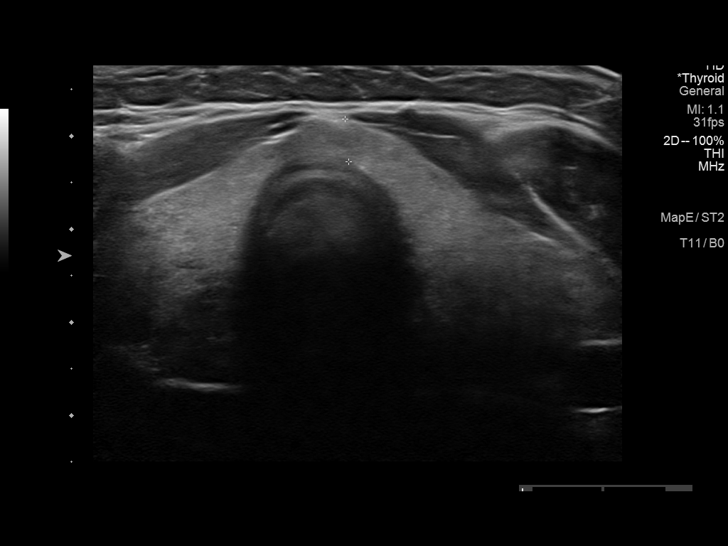
[im 3/36]
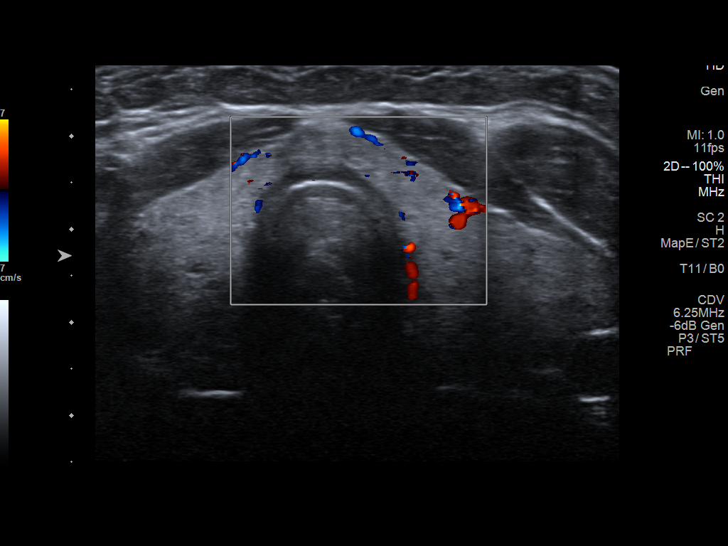
[im 6/36]
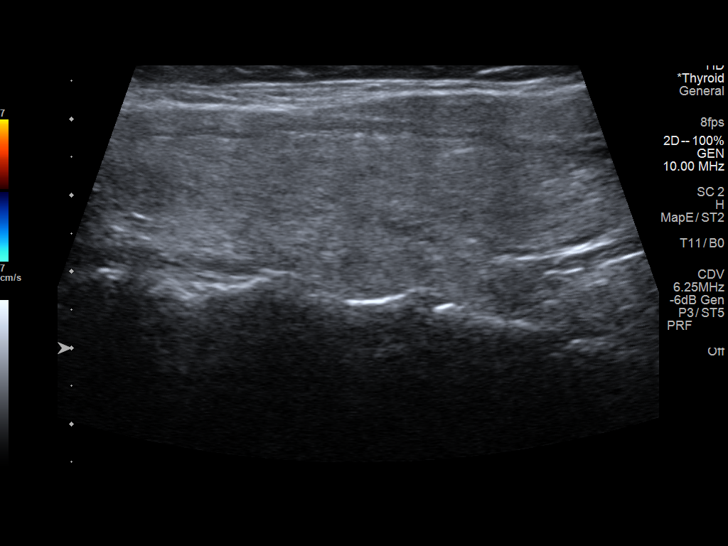
[im 9/36]
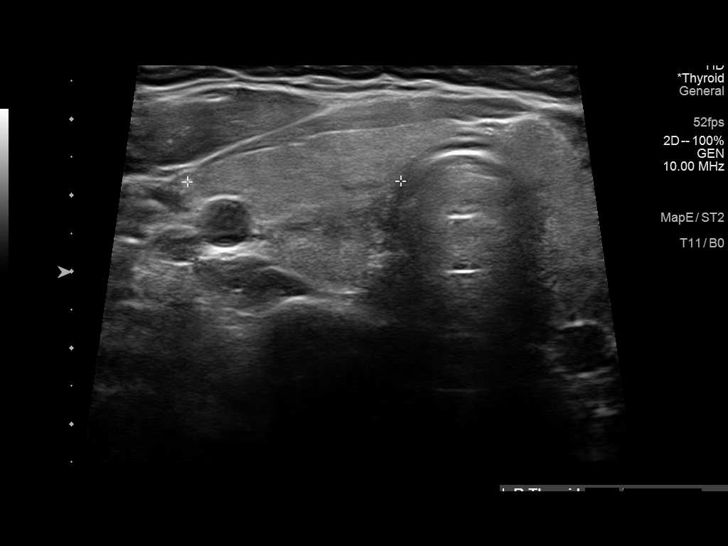
[im 12/36]
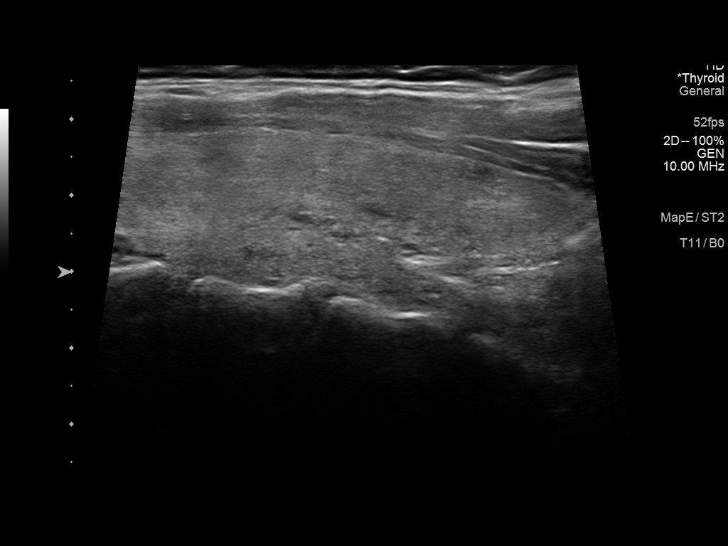
[im 14/36]
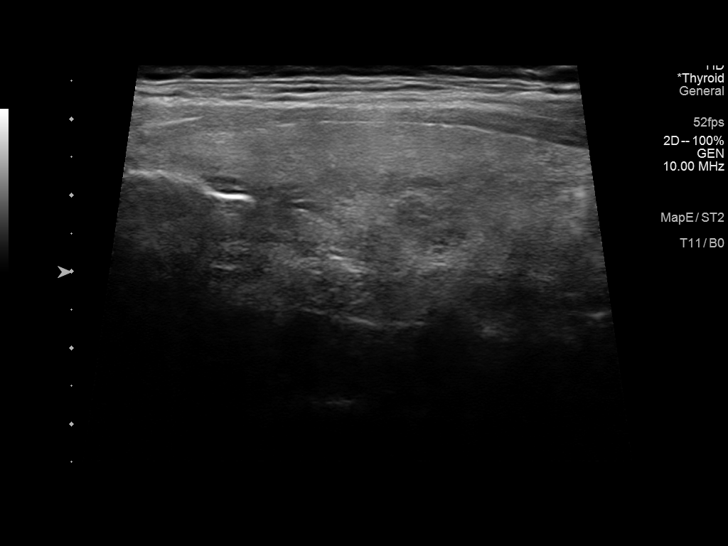
[im 17/36]
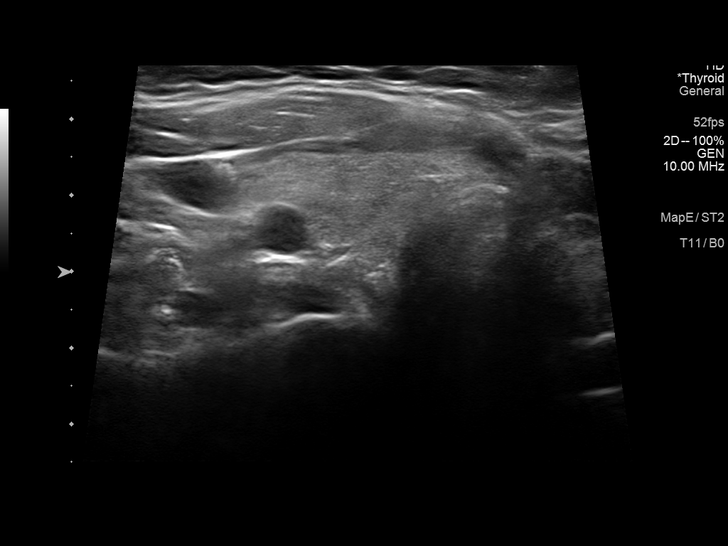
[im 19/36]
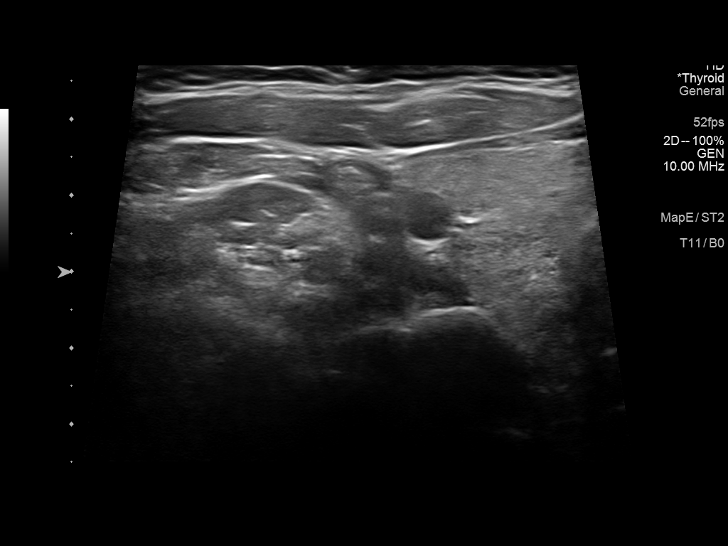
[im 22/36]
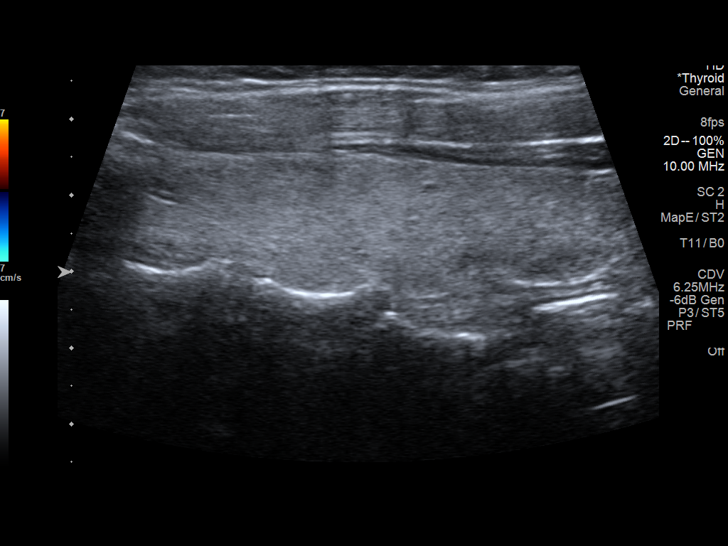
[im 24/36]
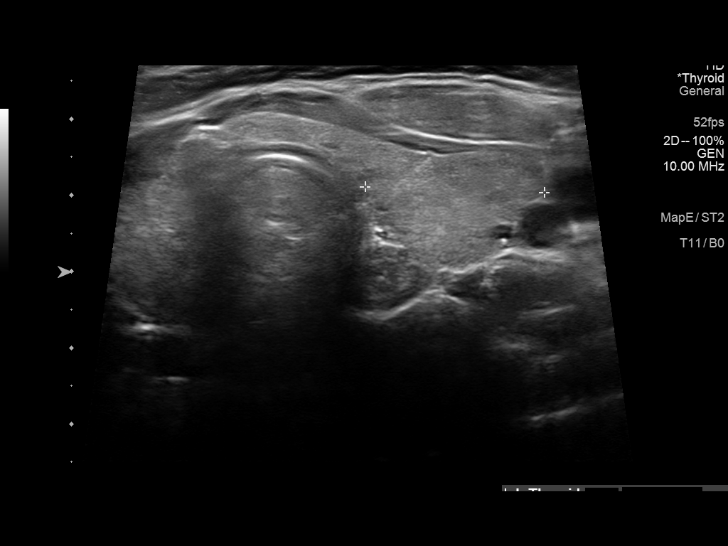
[im 27/36]
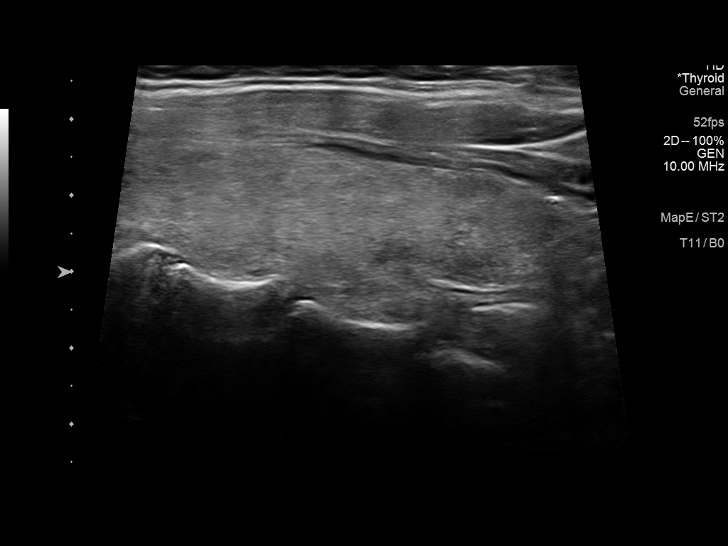
[im 30/36]
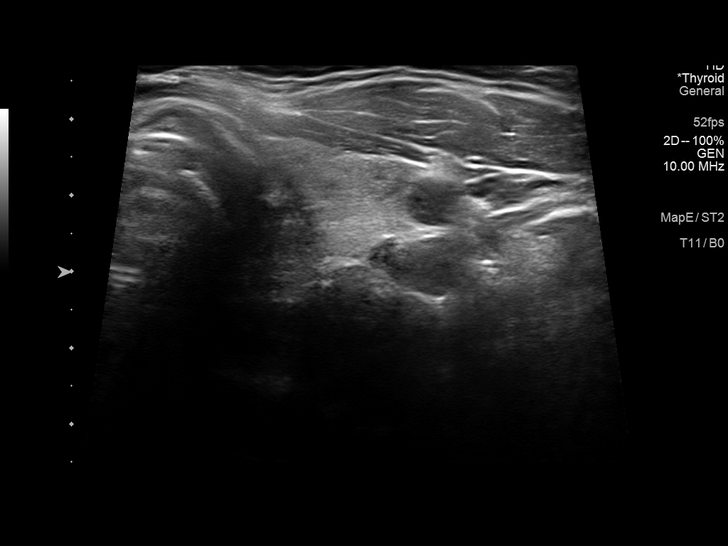
[im 33/36]
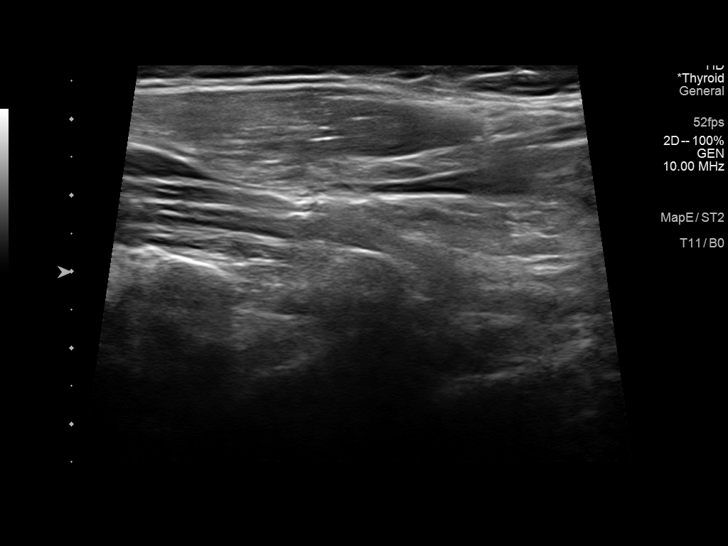
[im 36/36]
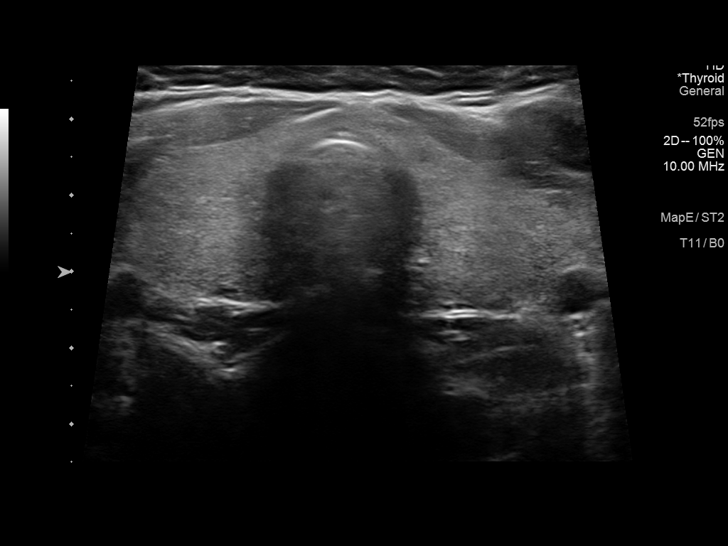

[14 of 25 positions shown; findings below may reference images not displayed]

FINDINGS: Parenchymal Echotexture: Mildly heterogenous

Isthmus: 0.5 cm

Right lobe: 7.1 x 2.2 x 2.8 cm

Left lobe: 7.0 x 1.7 x 2.4 cm

_________________________________________________________

Estimated total number of nodules >/= 1 cm: 0

Number of spongiform nodules >/=  2 cm not described below (TR1): 0

Number of mixed cystic and solid nodules >/= 1.5 cm not described
below (TR2): 0

_________________________________________________________

No discrete nodules are seen within the thyroid gland. No cervical
lymphadenopathy.
IMPRESSION: Mildly enlarged and mildly heterogeneous thyroid gland, which are
nonspecific findings. No discrete nodules.

## 2022-03-14 ENCOUNTER — Other Ambulatory Visit: Payer: Self-pay

## 2022-03-14 DIAGNOSIS — R0683 Snoring: Secondary | ICD-10-CM

## 2022-03-14 NOTE — Progress Notes (Unsigned)
Sleep test ordered for  ?

## 2022-04-17 ENCOUNTER — Other Ambulatory Visit (INDEPENDENT_AMBULATORY_CARE_PROVIDER_SITE_OTHER): Payer: 59

## 2022-04-17 DIAGNOSIS — E0590 Thyrotoxicosis, unspecified without thyrotoxic crisis or storm: Secondary | ICD-10-CM

## 2022-04-17 LAB — TSH: TSH: 0.7 u[IU]/mL (ref 0.35–5.50)

## 2022-04-17 LAB — T4, FREE: Free T4: 0.98 ng/dL (ref 0.60–1.60)

## 2022-05-13 ENCOUNTER — Encounter: Payer: Self-pay | Admitting: Internal Medicine

## 2022-05-14 ENCOUNTER — Other Ambulatory Visit: Payer: Self-pay | Admitting: Internal Medicine

## 2022-05-14 ENCOUNTER — Ambulatory Visit (HOSPITAL_BASED_OUTPATIENT_CLINIC_OR_DEPARTMENT_OTHER): Payer: 59 | Attending: Physician Assistant | Admitting: Internal Medicine

## 2022-05-14 DIAGNOSIS — G4733 Obstructive sleep apnea (adult) (pediatric): Secondary | ICD-10-CM | POA: Insufficient documentation

## 2022-05-14 DIAGNOSIS — R0683 Snoring: Secondary | ICD-10-CM | POA: Diagnosis present

## 2022-05-14 DIAGNOSIS — E0590 Thyrotoxicosis, unspecified without thyrotoxic crisis or storm: Secondary | ICD-10-CM

## 2022-05-14 NOTE — Telephone Encounter (Signed)
Patient scheduled for 06/18/22

## 2022-05-18 DIAGNOSIS — R0683 Snoring: Secondary | ICD-10-CM | POA: Diagnosis not present

## 2022-05-24 ENCOUNTER — Telehealth: Payer: Self-pay | Admitting: Physician Assistant

## 2022-05-24 ENCOUNTER — Encounter: Payer: Self-pay | Admitting: Internal Medicine

## 2022-05-24 ENCOUNTER — Other Ambulatory Visit: Payer: Self-pay | Admitting: Physician Assistant

## 2022-05-24 DIAGNOSIS — G4733 Obstructive sleep apnea (adult) (pediatric): Secondary | ICD-10-CM | POA: Insufficient documentation

## 2022-05-24 NOTE — Telephone Encounter (Signed)
Thank you :)

## 2022-05-24 NOTE — Telephone Encounter (Signed)
Pt called and was worried about her sleep study results she would like to know the results of them States no one has called about them  She can be reached at [phone]

## 2022-05-24 NOTE — Telephone Encounter (Signed)
I did refer her for it, but please ask her to follow up with the office that did the study, Jetty Duhamel MD.

## 2022-05-24 NOTE — Telephone Encounter (Signed)
Dawn Barajas, please call to ask if she has a follow up appointment scheduled with the office that ordered the sleep study, if not, then she can call to schedule an appointment to follow up with them to review the results.

## 2022-05-24 NOTE — Telephone Encounter (Signed)
They suggest that she get a CPAP titration sleep study or autopap. I will order that test and they will contact her to schedule it. Thank you.

## 2022-06-07 ENCOUNTER — Encounter: Payer: Self-pay | Admitting: Family Medicine

## 2022-06-07 ENCOUNTER — Ambulatory Visit: Payer: 59 | Admitting: Family Medicine

## 2022-06-07 VITALS — BP 100/70 | HR 73 | Temp 98.6°F | Wt 221.2 lb

## 2022-06-07 DIAGNOSIS — H9201 Otalgia, right ear: Secondary | ICD-10-CM | POA: Diagnosis not present

## 2022-06-07 DIAGNOSIS — H1033 Unspecified acute conjunctivitis, bilateral: Secondary | ICD-10-CM | POA: Diagnosis not present

## 2022-06-07 MED ORDER — AMOXICILLIN 875 MG PO TABS: 875.0000 mg | ORAL_TABLET | Freq: Two times a day (BID) | ORAL | 0 refills | Status: DC

## 2022-06-07 NOTE — Progress Notes (Signed)
   Subjective:    Patient ID: Dawn Barajas, female    DOB: 1986/05/05, 36 y.o.   MRN: [id number]  HPI She states that approximately week ago she developed a slightly sore throat with a dry cough that progressed to right earache and slight right-sided headache on Tuesday.  Yesterday she developed some drainage from the right eye.  She states that the drainage is now diminished.  She does not smoke and has no allergies.   Review of Systems     Objective:   Physical Exam Alert and in no distress.  Right conjunctive is slightly injected.  Tympanic membranes and canals are normal. Pharyngeal area is normal. Neck is supple without adenopathy or thyromegaly. Cardiac exam shows a regular sinus rhythm without murmurs or gallops. Lungs are clear to auscultation.        Assessment & Plan:  Acute conjunctivitis of both eyes, unspecified acute conjunctivitis type - Plan: amoxicillin (AMOXIL) 875 MG tablet  Earache on right I explained that she probably does have a slight infection and I will go ahead and treat although her symptoms are very minimal.  She was comfortable with that.

## 2022-06-18 ENCOUNTER — Other Ambulatory Visit (INDEPENDENT_AMBULATORY_CARE_PROVIDER_SITE_OTHER): Payer: 59

## 2022-06-18 DIAGNOSIS — E0590 Thyrotoxicosis, unspecified without thyrotoxic crisis or storm: Secondary | ICD-10-CM

## 2022-06-18 LAB — TSH: TSH: 0.47 u[IU]/mL (ref 0.35–5.50)

## 2022-06-18 LAB — T4, FREE: Free T4: 0.8 ng/dL (ref 0.60–1.60)

## 2022-06-19 LAB — T3: T3, Total: 95 ng/dL (ref 76–181)

## 2022-06-21 ENCOUNTER — Ambulatory Visit: Payer: 59 | Admitting: Internal Medicine

## 2022-07-08 ENCOUNTER — Telehealth: Payer: 59 | Admitting: Family Medicine

## 2022-07-08 ENCOUNTER — Other Ambulatory Visit (INDEPENDENT_AMBULATORY_CARE_PROVIDER_SITE_OTHER): Payer: 59

## 2022-07-08 ENCOUNTER — Other Ambulatory Visit: Payer: Self-pay | Admitting: *Deleted

## 2022-07-08 ENCOUNTER — Encounter: Payer: Self-pay | Admitting: Family Medicine

## 2022-07-08 VITALS — Temp 99.4°F | Ht 69.0 in | Wt 220.0 lb

## 2022-07-08 DIAGNOSIS — U071 COVID-19: Secondary | ICD-10-CM

## 2022-07-08 LAB — POC COVID19 BINAXNOW: SARS Coronavirus 2 Ag: POSITIVE — AB

## 2022-07-08 NOTE — Progress Notes (Signed)
Start time: 11:09 End time: 11:24  Virtual Visit via Video Note  I connected with Ryla Cauthon on 07/08/22 by a video enabled telemedicine application and verified that I am speaking with the correct person using two identifiers.  Location: Patient: home Provider: office   I discussed the limitations of evaluation and management by telemedicine and the availability of in person appointments. The patient expressed understanding and agreed to proceed.  History of Present Illness:  Chief Complaint  Patient presents with   Covid Positive    VIRTUAL positive covid Saturday. Symptoms started Friday. Symptoms started with ST and dry cough. Needs positive test documentation for work from doctor.    Symptoms started on Friday with sore throat, then developed cough (dry, hacking). Sat/Sun she had a bad headache.  Tested + for COVID on 8/12.  Yesterday she noted a fever of 100.5. +fatigue.  Yesterday had myalgias, felt like she was "hit by a truck". Slight nasal congestion, clear mucus. No sinus headaches.  No chest pain, SOB, wheezing. Some sharp chest pain with sneezing.  PMH, PSH, SH reviewed Hyperthyroid, on treatment  Outpatient Encounter Medications as of 07/08/2022  Medication Sig Note   cholecalciferol (VITAMIN D3) 25 MCG (1000 UNIT) tablet Take 1,000 Units by mouth daily.    escitalopram (LEXAPRO) 10 MG tablet Take 1 tablet (10 mg total) by mouth daily.    ibuprofen (ADVIL) 200 MG tablet Take 400 mg by mouth every 6 (six) hours as needed. 07/08/2022: Last dose yesterday afternoon.   IUD'S IU by Intrauterine route.    methimazole (TAPAZOLE) 5 MG tablet Take 1 tablet (5 mg total) by mouth daily.    Multiple Vitamin (MULTIVITAMIN PO) Take by mouth.    [DISCONTINUED] amoxicillin (AMOXIL) 875 MG tablet Take 1 tablet (875 mg total) by mouth 2 (two) times daily.    No facility-administered encounter medications on file as of 07/08/2022.   No Known Allergies  ROS:  +f/c, cough,  myalgias per HPI.  No chest pain, shortness of breath, n/v/d, rash or other issues. See HPI.   Observations/Objective:  Temp 99.4 F (37.4 C) (Temporal)   Ht 5\' 9"  (1.753 m)   Wt 220 lb (99.8 kg)   BMI 32.49 kg/m   Pleasant, well-appearing female in no distress. No coughing during visit, speaking comfortably. She is alert and oriented. Cranial nerves grossly intact. Exam is limited due to the virtual nature of the visit.   Assessment and Plan:  COVID-19 virus infection - supportive measures reviewed, counseled re: isolation/masking, RTW recommendations and OTC meds   Use Tylenol and/or ibuprofen as needed fever Use plain Mucinex 12 hour twice daily, and use a separate Delsym syrup only if needed for cough (vs using the same nighttime medication your wife takes at night, to prevent coughing at night, instead of Delsym).  Friday was first day of symptoms = day 0 Isolation on days 1-5 (Sat through Wednesday) Masking days 6-10 (Thursday through Monday). You can leave isolation on day 6 (Thursday) if you are no longer having fever and cough is significantly improved.  You need to wear a mask when out of the home, and not eat around others.  Follow up or seek immediate evaluation if you have persistent/worsening fever, chest pain, shortness of breath, pain with breathing, swollen or painful legs, neurologic changes, or other concerns.   Follow Up Instructions:    I discussed the assessment and treatment plan with the patient. The patient was provided an opportunity to ask questions and  all were answered. The patient agreed with the plan and demonstrated an understanding of the instructions.   The patient was advised to call back or seek an in-person evaluation if the symptoms worsen or if the condition fails to improve as anticipated.  I spent 20 minutes dedicated to the care of this patient, including pre-visit review of records, face to face time, post-visit ordering of testing  and documentation.    Lavonda Jumbo, MD

## 2022-07-08 NOTE — Patient Instructions (Signed)
  Use Tylenol and/or ibuprofen as needed fever Use plain Mucinex 12 hour twice daily, and use a separate Delsym syrup only if needed for cough (vs using the same nighttime medication your wife takes at night, to prevent coughing at night, instead of Delsym).  Friday was first day of symptoms = day 0 Isolation on days 1-5 (Sat through Wednesday) Masking days 6-10 (Thursday through Monday). You can leave isolation on day 6 (Thursday) if you are no longer having fever and cough is significantly improved.  You need to wear a mask when out of the home, and not eat around others.  Follow up or seek immediate evaluation if you have persistent/worsening fever, chest pain, shortness of breath, pain with breathing, swollen or painful legs, neurologic changes, or other concerns.

## 2022-07-19 NOTE — Progress Notes (Unsigned)
07/22/22- 36 yoF never smoker for sleep evaluation with concern of OSA, courtesy of Burnard Hawthorne, PA-C Medical problem list includes Sinusitis, Hyperthyroid, Depression, Hyperlipidemia,  HST-05/14/22- AHI 42.3/ hr, desaturation to 80%, body weight 220 lbs Epworth score-4 Body weight today-224 lbs -----Referred for sleep consult, HST 04/2022, states snoring, witnessed apneas, daytime tiredness. Denies ENT surgery, lung or heart disease.Marland Kitchen No family hx of OSA. Daytime desk job for [employer] No sleep meds. 1-2 cups coffee.  Sleep study reviewed. Medical implicattions, treatment options, safe driving.  Prior to Admission medications   Medication Sig Start Date End Date Taking? Authorizing Provider  cholecalciferol (VITAMIN D3) 25 MCG (1000 UNIT) tablet Take 1,000 Units by mouth daily.   Yes [provider]  escitalopram (LEXAPRO) 10 MG tablet Take 1 tablet (10 mg total) by mouth daily. 02/14/22  Yes Burnard Hawthorne B, PA-C  ibuprofen (ADVIL) 200 MG tablet Take 400 mg by mouth every 6 (six) hours as needed.   Yes [provider]  IUD'S IU by Intrauterine route.   Yes [provider]  methimazole (TAPAZOLE) 5 MG tablet Take 1 tablet (5 mg total) by mouth daily. 02/19/22  Yes Shamleffer, Konrad Dolores, MD  Multiple Vitamin (MULTIVITAMIN PO) Take by mouth.   Yes [provider]   Past Medical History:  Diagnosis Date   Childhood asthma    History of chicken pox    Lumbar degenerative disc disease    Past Surgical History:  Procedure Laterality Date   WISDOM TOOTH EXTRACTION     Family History  Problem Relation Age of Onset   Diabetes Mother    Thyroid cancer Mother    Breast cancer Mother 54   Colon cancer Mother 27   Depression Mother    Obesity Mother    Hypertension Mother    Skin cancer Father 31   Pancreatic cancer Father 6   Depression Sister    Stroke Paternal Grandmother 43   Social History   Socioeconomic History   Marital  status: Married    Spouse name: Not on file   Number of children: Not on file   Years of education: Not on file   Highest education level: Not on file  Occupational History   Not on file  Tobacco Use   Smoking status: Never   Smokeless tobacco: Never  Vaping Use   Vaping Use: Never used  Substance and Sexual Activity   Alcohol use: Yes    Comment: one a week   Drug use: No   Sexual activity: Not on file  Other Topics Concern   Not on file  Social History Narrative   Not on file   Social Determinants of Health   Financial Resource Strain: Not on file  Food Insecurity: Not on file  Transportation Needs: Not on file  Physical Activity: Not on file  Stress: Not on file  Social Connections: Not on file  Intimate Partner Violence: Not on file   ROS-see HPI   + = positive Constitutional:    weight loss, night sweats, fevers, chills, fatigue, lassitude. HEENT:    headaches, difficulty swallowing, tooth/dental problems, sore throat,       sneezing, itching, ear ache, nasal congestion, post nasal drip, snoring CV:    chest pain, orthopnea, PND, swelling in lower extremities, anasarca,  dizziness, palpitations Resp:   shortness of breath with exertion or at rest.                productive cough,   non-productive cough, coughing up of blood.              change in color of mucus.  wheezing.   Skin:    rash or lesions. GI:  No-   heartburn, indigestion, abdominal pain, nausea, vomiting, diarrhea,                 change in bowel habits, loss of appetite GU: dysuria, change in color of urine, no urgency or frequency.   flank pain. MS:   joint pain, +stiffness, decreased range of motion, back pain. Neuro-     nothing unusual Psych:  change in mood or affect.  +depression or anxiety.   memory loss.  OBJ- Physical Exam General- Alert, Oriented, Affect-appropriate, Distress- none acute, +overweight Skin- rash-none, lesions- none, excoriation-  none Lymphadenopathy- none Head- atraumatic            Eyes- Gross vision intact, PERRLA, conjunctivae and secretions clear            Ears- Hearing, canals-normal            Nose- Clear, no-Septal dev, mucus, polyps, erosion, perforation             Throat- Mallampati III-IV , mucosa clear , drainage- none, tonsils+, teeth+ Neck- flexible , trachea midline, no stridor , thyroid nl, carotid no bruit Chest - symmetrical excursion , unlabored           Heart/CV- RRR , no murmur , no gallop  , no rub, nl s1 s2                           - JVD- none , edema- none, stasis changes- none, varices- none           Lung- clear to P&A, wheeze- none, cough- none , dullness-none, rub- none           Chest wall-  Abd-  Br/ Gen/ Rectal- Not done, not indicated Extrem- cyanosis- none, clubbing, none, atrophy- none, strength- nl Neuro- grossly intact to observation

## 2022-07-22 ENCOUNTER — Ambulatory Visit (INDEPENDENT_AMBULATORY_CARE_PROVIDER_SITE_OTHER): Payer: 59 | Admitting: Internal Medicine

## 2022-07-22 ENCOUNTER — Encounter: Payer: Self-pay | Admitting: Internal Medicine

## 2022-07-22 VITALS — BP 116/68 | HR 74 | Temp 98.1°F | Ht 69.0 in | Wt 224.4 lb

## 2022-07-22 DIAGNOSIS — G4733 Obstructive sleep apnea (adult) (pediatric): Secondary | ICD-10-CM

## 2022-07-22 DIAGNOSIS — E0590 Thyrotoxicosis, unspecified without thyrotoxic crisis or storm: Secondary | ICD-10-CM | POA: Diagnosis not present

## 2022-07-22 NOTE — Assessment & Plan Note (Signed)
Being monitored

## 2022-07-22 NOTE — Assessment & Plan Note (Signed)
Appropriate discussion. Treatment options reviewed. She is concerned claustrophobia may interfere with CPAP, but agrees to try that while also talking to ENT to look at surgical options.  Plan- CPAP 5-20, ENT referral

## 2022-07-22 NOTE — Patient Instructions (Signed)
Order-   new DME, new CPAP auto 5-20, mask of choice, humidifier, supplies, AirView/ card  Order - refer to ENT  consider options for sleep apnea  Please call if we can help

## 2022-07-23 NOTE — Addendum Note (Signed)
Addended by: Jacquiline Doe on: 07/23/2022 09:18 AM   Modules accepted: Orders

## 2022-07-31 ENCOUNTER — Encounter: Payer: Self-pay | Admitting: Internal Medicine

## 2022-08-30 ENCOUNTER — Encounter: Payer: 59 | Admitting: Physician Assistant

## 2022-09-02 ENCOUNTER — Ambulatory Visit: Payer: 59 | Admitting: Medical

## 2022-09-02 ENCOUNTER — Encounter: Payer: Self-pay | Admitting: Medical

## 2022-09-02 VITALS — BP 120/80 | HR 68 | Ht 69.0 in | Wt 225.2 lb

## 2022-09-02 DIAGNOSIS — Z23 Encounter for immunization: Secondary | ICD-10-CM | POA: Diagnosis not present

## 2022-09-02 DIAGNOSIS — G4733 Obstructive sleep apnea (adult) (pediatric): Secondary | ICD-10-CM

## 2022-09-02 DIAGNOSIS — Z0000 Encounter for general adult medical examination without abnormal findings: Secondary | ICD-10-CM

## 2022-09-02 DIAGNOSIS — E0590 Thyrotoxicosis, unspecified without thyrotoxic crisis or storm: Secondary | ICD-10-CM

## 2022-09-02 DIAGNOSIS — Z131 Encounter for screening for diabetes mellitus: Secondary | ICD-10-CM

## 2022-09-02 MED ORDER — LORAZEPAM 0.5 MG PO TABS: 0.5000 mg | ORAL_TABLET | Freq: Two times a day (BID) | ORAL | 0 refills | Status: DC | PRN

## 2022-09-02 NOTE — Progress Notes (Signed)
Subjective: Chief Complaint  Patient presents with   nonfasting cpe    Nonfasitng cpe, sees obgyn- [facility]   Medical team: Dr. Lowella Dell, endocrinology Dr. Baird Lyons, pulmonology Sees eye doctor and dentist Dr. Tiana Loft, gynecology, [facility]   Concerns: Overall doing okay.  Compliant with Lexapro without complaint.  She sees endocrinology for subclinical hyperthyroidism on methimazole.  She recently started CPAP and is 8 days into this but having trouble with the mask.  She saw pulmonology for this.  She would like a flu shot today  Sees dentist Sees eye doctor   Reviewed their medical, surgical, family, social, medication, and allergy history and updated chart as appropriate.  Past Medical History:  Diagnosis Date   Childhood asthma    Depression    History of chicken pox    IUD (intrauterine device) in place 2023   Lumbar degenerative disc disease    OSA (obstructive sleep apnea) 2023   Subclinical hyperthyroidism 2022   sees endo    Family History  Problem Relation Age of Onset   Diabetes Mother    Thyroid cancer Mother    Breast cancer Mother 71   Colon cancer Mother 25   Depression Mother    Obesity Mother    Hypertension Mother    Heart failure Mother    Skin cancer Father 91   Pancreatic cancer Father 33   Depression Sister    Stroke Paternal Grandmother 4     Current Outpatient Medications:    cholecalciferol (VITAMIN D3) 25 MCG (1000 UNIT) tablet, Take 1,000 Units by mouth daily., Disp: , Rfl:    escitalopram (LEXAPRO) 10 MG tablet, Take 1 tablet (10 mg total) by mouth daily., Disp: 90 tablet, Rfl: 1   IUD'S IU, by Intrauterine route., Disp: , Rfl:    LORazepam (ATIVAN) 0.5 MG tablet, Take 1 tablet (0.5 mg total) by mouth 2 (two) times daily as needed for anxiety., Disp: 12 tablet, Rfl: 0   methimazole (TAPAZOLE) 5 MG tablet, Take 1 tablet (5 mg total) by mouth daily., Disp: 90 tablet, Rfl: 2   Multiple Vitamin  (MULTIVITAMIN PO), Take by mouth., Disp: , Rfl:   No Known Allergies    Review of Systems Constitutional: -fever, -chills, -sweats, -unexpected weight change, -decreased appetite, -fatigue Allergy: -sneezing, -itching, -congestion Dermatology: -changing moles, --rash, -lumps ENT: -runny nose, -ear pain, -sore throat, -hoarseness, -sinus pain, -teeth pain, - ringing in ears, -hearing loss, -nosebleeds Cardiology: -chest pain, -palpitations, -swelling, -difficulty breathing when lying flat, -waking up short of breath Respiratory: -cough, -shortness of breath, -difficulty breathing with exercise or exertion, -wheezing, -coughing up blood Gastroenterology: -abdominal pain, -nausea, -vomiting, -diarrhea, -constipation, -blood in stool, -changes in bowel movement, -difficulty swallowing or eating Hematology: -bleeding, -bruising  Musculoskeletal: -joint aches, -muscle aches, -joint swelling, -back pain, -neck pain, -cramping, -changes in gait Ophthalmology: denies vision changes, eye redness, itching, discharge Urology: -burning with urination, -difficulty urinating, -blood in urine, -urinary frequency, -urgency, -incontinence Neurology: -headache, -weakness, -tingling, -numbness, -memory loss, -falls, -dizziness Psychology: -depressed mood, -agitation, +sleep problems Breast/gyn: -breast tendnerss, -discharge, -lumps, -vaginal discharge,- irregular periods, -heavy periods      09/02/2022    2:29 PM 02/14/2022    3:55 PM 08/22/2021    9:53 AM 04/18/2021   11:19 AM 02/13/2021    9:58 AM  Depression screen PHQ 2/9  Decreased Interest 0 0 0 0 0  Down, Depressed, Hopeless 0 0 0 0 0  PHQ - 2 Score 0 0 0 0  0  Altered sleeping 3      Tired, decreased energy 1      Change in appetite 0      Feeling bad or failure about yourself  0      Trouble concentrating 0      Moving slowly or fidgety/restless 0      Suicidal thoughts 0      PHQ-9 Score 4      Difficult doing work/chores Not difficult at  all           Objective:  BP 120/80   Pulse 68   Ht 5\' 9"  (1.753 m)   Wt 225 lb 3.2 oz (102.2 kg)   BMI 33.26 kg/m   General appearance: alert, no distress, WD/WN, Caucasian female Skin: unremarkable lesions, scattered macules HEENT: normocephalic, conjunctiva/corneas normal, sclerae anicteric, PERRLA, EOMi, nares patent, no discharge or erythema, pharynx normal Oral cavity: MMM, tongue normal, teeth normal Neck: supple, no lymphadenopathy, no thyromegaly, no masses, normal ROM, no bruits Chest: non tender, normal shape and expansion Heart: RRR, normal S1, S2, no murmurs Lungs: CTA bilaterally, no wheezes, rhonchi, or rales Abdomen: +bs, soft, non tender, non distended, no masses, no hepatomegaly, no splenomegaly, no bruits Back: non tender, normal ROM, no scoliosis Musculoskeletal: upper extremities non tender, no obvious deformity, normal ROM throughout, lower extremities non tender, no obvious deformity, normal ROM throughout Extremities: no edema, no cyanosis, no clubbing Pulses: 2+ symmetric, upper and lower extremities, normal cap refill Neurological: alert, oriented x 3, CN2-12 intact, strength normal upper extremities and lower extremities, sensation normal throughout, DTRs 2+ throughout, no cerebellar signs, gait normal Psychiatric: normal affect, behavior normal, pleasant  Breast/gyn/rectal - deferred to gynecology     Assessment and Plan :   Encounter Diagnoses  Name Primary?   Encounter for health maintenance examination in adult Yes   Subclinical hyperthyroidism    Need for influenza vaccination    OSA (obstructive sleep apnea)    Screening for diabetes mellitus      This visit was a preventative care visit, also known as wellness visit or routine physical.   Topics typically include healthy lifestyle, diet, exercise, preventative care, vaccinations, sick and well care, proper use of emergency dept and after hours care, as well as other concerns.      Recommendations: Continue to return yearly for your annual wellness and preventative care visits.  This gives [place] a chance to discuss healthy lifestyle, exercise, vaccinations, review your chart record, and perform screenings where appropriate.  I recommend you see your eye doctor yearly for routine vision care.  I recommend you see your dentist yearly for routine dental care including hygiene visits twice yearly.  See your gynecologist yearly for routine gynecological care.   Vaccination recommendations were reviewed Immunization History  Administered Date(s) Administered   Influenza Split 09/12/2016   Influenza,inj,Quad PF,6+ Mos 08/22/2021, 09/02/2022   PFIZER(Purple Top)SARS-COV-2 Vaccination 02/17/2020, 03/24/2020, 11/22/2020   Tdap 09/25/2016    Counseled on the influenza virus vaccine.  Vaccine information sheet given.  Influenza vaccine given after consent obtained.   Screening for cancer: Colon cancer screening: Age 18  Breast cancer screening: You should perform a self breast exam monthly.   We reviewed recommendations for regular mammograms and breast cancer screening.  Cervical cancer screening: We reviewed recommendations for pap smear screening.   Skin cancer screening: Check your skin regularly for new changes, growing lesions, or other lesions of concern Come in for evaluation if you have skin lesions of  concern.  Lung cancer screening: If you have a greater than 20 pack year history of tobacco use, then you may qualify for lung cancer screening with a chest CT scan.   Please call your insurance company to inquire about coverage for this test.  We currently don't have screenings for other cancers besides breast, cervical, colon, and lung cancers.  If you have a strong family history of cancer or have other cancer screening concerns, please let me know.    Bone health: Get at least 150 minutes of aerobic exercise weekly Get weight bearing exercise at  least once weekly Bone density test:  A bone density test is an imaging test that uses a type of X-ray to measure the amount of calcium and other minerals in your bones. The test may be used to diagnose or screen you for a condition that causes weak or thin bones (osteoporosis), predict your risk for a broken bone (fracture), or determine how well your osteoporosis treatment is working. The bone density test is recommended for females 23 and older, or females or males <16 if certain risk factors such as thyroid disease, long term use of steroids such as for asthma or rheumatological issues, vitamin D deficiency, estrogen deficiency, family history of osteoporosis, self or family history of fragility fracture in first degree relative.    Heart health: Get at least 150 minutes of aerobic exercise weekly Limit alcohol It is important to maintain a healthy blood pressure and healthy cholesterol numbers  Heart disease screening: Screening for heart disease includes screening for blood pressure, fasting lipids, glucose/diabetes screening, BMI height to weight ratio, reviewed of smoking status, physical activity, and diet.    Goals include blood pressure 120/80 or less, maintaining a healthy lipid/cholesterol profile, preventing diabetes or keeping diabetes numbers under good control, not smoking or using tobacco products, exercising most days per week or at least 150 minutes per week of exercise, and eating healthy variety of fruits and vegetables, healthy oils, and avoiding unhealthy food choices like fried food, fast food, high sugar and high cholesterol foods.    Other tests may possibly include EKG test, CT coronary calcium score, echocardiogram, exercise treadmill stress test.    Medical care options: I recommend you continue to seek care here first for routine care.  We try really hard to have available appointments Monday through Friday daytime hours for sick visits, acute visits, and  physicals.  Urgent care should be used for after hours and weekends for significant issues that cannot wait till the next day.  The emergency department should be used for significant potentially life-threatening emergencies.  The emergency department is expensive, can often have long wait times for less significant concerns, so try to utilize primary care, urgent care, or telemedicine when possible to avoid unnecessary trips to the emergency department.  Virtual visits and telemedicine have been introduced since the pandemic started in 2020, and can be convenient ways to receive medical care.  We offer virtual appointments as well to assist you in a variety of options to seek medical care.   Advanced Directives: I recommend you consider completing a [place] and Living Will.   These documents respect your wishes and help alleviate burdens on your loved ones if you were to become terminally ill or be in a position to need those documents enforced.    You can complete Advanced Directives yourself, have them notarized, then have copies made for our office, for you and for anybody  you feel should have them in safe keeping.  Or, you can have an attorney prepare these documents.   If you haven't updated your Last Will and Testament in a while, it may be worthwhile having an attorney prepare these documents together and save on some costs.       Separate significant issues discussed: Sleep apnea-we discussed possible ways to improve tolerance to CPAP.  She can try Ativan the next few nights to get better accustomed to the machine.  I advise she also called the help line for the CPAP device and follow-up with pulmonology as planned  Subclinical hyperthyroidism-managed by endocrinology    Camiah was seen today for nonfasting cpe.  Diagnoses and all orders for this visit:  Encounter for health maintenance examination in adult -     Comprehensive metabolic panel -     CBC -      Hemoglobin A1c -     HIV Antibody (routine testing w rflx)  Subclinical hyperthyroidism  Need for influenza vaccination -     Flu Vaccine QUAD 64mo+IM (Fluarix, Fluzone & Alfiuria Quad PF)  OSA (obstructive sleep apnea)  Screening for diabetes mellitus -     Hemoglobin A1c  Other orders -     LORazepam (ATIVAN) 0.5 MG tablet; Take 1 tablet (0.5 mg total) by mouth 2 (two) times daily as needed for anxiety.   Follow-up pending labs, yearly for physical

## 2022-09-03 ENCOUNTER — Encounter: Payer: Self-pay | Admitting: Internal Medicine

## 2022-09-04 LAB — COMPREHENSIVE METABOLIC PANEL
ALT: 22 IU/L (ref 0–32)
AST: 20 IU/L (ref 0–40)
Albumin/Globulin Ratio: 1.5 (ref 1.2–2.2)
Albumin: 4.5 g/dL (ref 3.9–4.9)
Alkaline Phosphatase: 60 IU/L (ref 44–121)
BUN/Creatinine Ratio: 12 (ref 9–23)
BUN: 9 mg/dL (ref 6–20)
Bilirubin Total: 0.6 mg/dL (ref 0.0–1.2)
CO2: 23 mmol/L (ref 20–29)
Calcium: 9.4 mg/dL (ref 8.7–10.2)
Chloride: 98 mmol/L (ref 96–106)
Creatinine, Ser: 0.73 mg/dL (ref 0.57–1.00)
Globulin, Total: 3 g/dL (ref 1.5–4.5)
Glucose: 125 mg/dL — ABNORMAL HIGH (ref 70–99)
Potassium: 4.6 mmol/L (ref 3.5–5.2)
Sodium: 140 mmol/L (ref 134–144)
Total Protein: 7.5 g/dL (ref 6.0–8.5)
eGFR: 109 mL/min/{1.73_m2} (ref >59)

## 2022-09-04 LAB — HIV ANTIBODY (ROUTINE TESTING W REFLEX): HIV Screen 4th Generation wRfx: NONREACTIVE

## 2022-09-04 LAB — CBC
Hematocrit: 43.4 % (ref 34.0–46.6)
Hemoglobin: 13.8 g/dL (ref 11.1–15.9)
MCH: 29 pg (ref 26.6–33.0)
MCHC: 31.8 g/dL (ref 31.5–35.7)
MCV: 91 fL (ref 79–97)
Platelets: 319 10*3/uL (ref 150–450)
RBC: 4.76 x10E6/uL (ref 3.77–5.28)
RDW: 12.6 % (ref 11.7–15.4)
WBC: 9.3 10*3/uL (ref 3.4–10.8)

## 2022-09-04 LAB — HEMOGLOBIN A1C
Est. average glucose Bld gHb Est-mCnc: 117 mg/dL
Hgb A1c MFr Bld: 5.7 % — ABNORMAL HIGH (ref 4.8–5.6)

## 2022-09-10 ENCOUNTER — Ambulatory Visit (INDEPENDENT_AMBULATORY_CARE_PROVIDER_SITE_OTHER): Payer: 59 | Admitting: Internal Medicine

## 2022-09-10 ENCOUNTER — Encounter: Payer: Self-pay | Admitting: Internal Medicine

## 2022-09-10 VITALS — BP 114/72 | HR 70 | Ht 69.0 in | Wt 225.0 lb

## 2022-09-10 DIAGNOSIS — E0590 Thyrotoxicosis, unspecified without thyrotoxic crisis or storm: Secondary | ICD-10-CM | POA: Diagnosis not present

## 2022-09-10 NOTE — Progress Notes (Unsigned)
Name: Dawn Barajas  MRN/ DOB: [id number], 09/15/1986    Age/ Sex: 36 y.o., female     PCP: Caryl Ada   Reason for Endocrinology Evaluation: Subclinical hyperthyroidism     Initial Endocrinology Clinic Visit: 04/11/2021    PATIENT IDENTIFIER: [name] is a 36 y.o., female with a past medical history of depression. She has followed with [facility] since 04/11/2021 for consultative assistance with management of her subclinical hyperthyroidism.   HISTORICAL SUMMARY: The patient was first diagnosed with subclinical hyperthyroidism in 2018 with a TSh nadir of 0.252 uIU/mL    She presented to Dr. Loanne Drilling in 03/2021 and transition to my care by 05/2021  TRAb negative  Thyroid ultrasound 05/2021 Mildly enlarged and mildly heterogeneous thyroid gland  She was started on Methimazole 05/2021 but developed a rash (which was attributed to hair products and eczema ) and was stopped by 06/2021, was restarted on methimazole 09/2021 due to fatigue and irritability and a TSH of 0.14 u IU/mL   Mother with thyroid cancer at age 49   SUBJECTIVE:    Today (09/10/2022):  [name] is here for subclinical hyperthyroidism .    Weight has been stable  Denies constipation or diarrhea  Denies tremors  Denies palpitations  Denies local neck swelling  LMP 08/29/2022 regular   Methimazole 5 mg daily  HISTORY:  Past Medical History:  Past Medical History:  Diagnosis Date   Childhood asthma    Depression    History of chicken pox    IUD (intrauterine device) in place 2023   Lumbar degenerative disc disease    OSA (obstructive sleep apnea) 2023   Subclinical hyperthyroidism 2022   sees endo   Past Surgical History:  Past Surgical History:  Procedure Laterality Date   FOREARM FRACTURE SURGERY     age 20   WISDOM TOOTH EXTRACTION     Social History:  reports that she has never smoked. She has never used smokeless tobacco. She reports current alcohol use.  She reports that she does not use drugs. Family History:  Family History  Problem Relation Age of Onset   Diabetes Mother    Thyroid cancer Mother    Breast cancer Mother 3   Colon cancer Mother 67   Depression Mother    Obesity Mother    Hypertension Mother    Heart failure Mother    Skin cancer Father 51   Pancreatic cancer Father 49   Depression Sister    Stroke Paternal Grandmother 33     HOME MEDICATIONS: Allergies as of 09/10/2022   No Known Allergies      Medication List        Accurate as of September 10, 2022  3:14 PM. If you have any questions, ask your nurse or doctor.          cholecalciferol 25 MCG (1000 UNIT) tablet Commonly known as: VITAMIN D3 Take 1,000 Units by mouth daily.   escitalopram 10 MG tablet Commonly known as: LEXAPRO Take 1 tablet (10 mg total) by mouth daily.   fluticasone 50 MCG/ACT nasal spray Commonly known as: FLONASE SMARTSIG:2 Spray(s) Both Nares Every Night   IUD'S IU by Intrauterine route.   LORazepam 0.5 MG tablet Commonly known as: ATIVAN Take 1 tablet (0.5 mg total) by mouth 2 (two) times daily as needed for anxiety.   methimazole 5 MG tablet Commonly known as: TAPAZOLE Take 1 tablet (5 mg total) by mouth daily.   MULTIVITAMIN PO  Take by mouth.          OBJECTIVE:   PHYSICAL EXAM: VS: BP 114/72 (BP Location: Left Arm, Patient Position: Sitting, Cuff Size: Large)   Pulse 70   Ht _0  (1.753 m)   Wt 225 lb (102.1 kg)   SpO2 98%   BMI 33.23 kg/m    EXAM: General: Pt appears well and is in NAD  Neck: General: Supple without adenopathy. Thyroid: Thyroid size normal.  No goiter or nodules appreciated.  Lungs: Clear with good BS bilat with no rales, rhonchi, or wheezes  Heart: Auscultation: RRR.  Abdomen: Normoactive bowel sounds, soft, nontender, without masses or organomegaly palpable  Extremities:  BL LE: No pretibial edema normal ROM and strength.  Skin: Patient has erythematous macular rash  over the lateral neck bilaterally, worse on the left, she also has patches of dried and flaky skin.  Patient also has evidence of seborrheic dermatitis over the scalp area  Mental Status: Judgment, insight: Intact Orientation: Oriented to time, place, and person Mood and affect: No depression, anxiety, or agitation     DATA REVIEWED:   Latest Reference Range & Units 09/10/22 15:27  TSH 0.35 - 5.50 uIU/mL 0.58  T4,Free(Direct) 0.60 - 1.60 ng/dL 0.80       Latest Reference Range & Units 09/02/22 14:57  WBC 3.4 - 10.8 x10E3/uL 9.3  RBC 3.77 - 5.28 x10E6/uL 4.76  Hemoglobin 11.1 - 15.9 g/dL 13.8  HCT 34.0 - 46.6 % 43.4  MCV 79 - 97 fL 91  MCH 26.6 - 33.0 pg 29.0  MCHC 31.5 - 35.7 g/dL 31.8  RDW 11.7 - 15.4 % 12.6  Platelets 150 - 450 x10E3/uL 319    Latest Reference Range & Units 09/02/22 14:57  Sodium 134 - 144 mmol/L 140  Potassium 3.5 - 5.2 mmol/L 4.6  Chloride 96 - 106 mmol/L 98  CO2 20 - 29 mmol/L 23  Glucose 70 - 99 mg/dL 125 (H)  BUN 6 - 20 mg/dL 9  Creatinine 0.57 - 1.00 mg/dL 0.73  Calcium 8.7 - 10.2 mg/dL 9.4  BUN/Creatinine Ratio 9 - 23  12  eGFR >59 mL/min/1.73 109  Alkaline Phosphatase 44 - 121 IU/L 60  Albumin 3.9 - 4.9 g/dL 4.5  Albumin/Globulin Ratio 1.2 - 2.2  1.5  AST 0 - 40 IU/L 20  ALT 0 - 32 IU/L 22  Total Protein 6.0 - 8.5 g/dL 7.5  Total Bilirubin 0.0 - 1.2 mg/dL 0.6  (H): Data is abnormally high   ASSESSMENT / PLAN / RECOMMENDATIONS:   Subclinical hyperthyroid:   -Pt is clinically euthyroid  - Clinical scenario consistent with Graves'  -TRAb has come back negative -Thyroid ultrasound does not show any evidence of nodules, but I 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 based on age and ultrasound imaging of the thyroid gland with enlarged size and heterogeneity -The reason for treatment is due to chronicity of low TSH since  2018 - TFT' normal, no change  Medications   Continue Methimazole 5 mg daily    Follow-up in 6  months  Signed electronically by: Mack Guise, MD  [facility] End[facility]in[facility]  [facility] [address], [place] [address] Phone: [phone] FAX: [phone]      CC: Carlena Hurl, PA-C [place] [address] Phone: [phone]  Fax: [phone]   Return to Endocrinology clinic as below: Future Appointments  Date Time Provider [place]  10/22/2022  4:00 PM Young,  Kasandra Knudsen, MD [facility] None  09/09/2023  8:30 AM Tysinger, Camelia Eng, PA-C [facility]

## 2022-09-11 ENCOUNTER — Ambulatory Visit: Payer: 59 | Admitting: Internal Medicine

## 2022-09-11 LAB — TSH: TSH: 0.58 u[IU]/mL (ref 0.35–5.50)

## 2022-09-11 LAB — T4, FREE: Free T4: 0.8 ng/dL (ref 0.60–1.60)

## 2022-10-16 ENCOUNTER — Other Ambulatory Visit: Payer: Self-pay | Admitting: Physician Assistant

## 2022-10-20 NOTE — Progress Notes (Signed)
07/22/22- 36 yoF never smoker for sleep evaluation with concern of OSA, courtesy of Burnard Hawthorne, PA-C Medical problem list includes Sinusitis, Hyperthyroid, Depression, Hyperlipidemia,  HST-05/14/22- AHI 42.3/ hr, desaturation to 80%, body weight 220 lbs Epworth score-4 Body weight today-224 lbs -----Referred for sleep consult, HST 04/2022, states snoring, witnessed apneas, daytime tiredness. Denies ENT surgery, lung or heart disease.Marland Kitchen No family hx of OSA. Daytime desk job for [employer] No sleep meds. 1-2 cups coffee.  Sleep study reviewed. Medical implicattions, treatment options, safe driving.  10/22/22- 36 yoF never smokerfollowed for OSA, complicated by  Sinusitis, Hyperthyroid, Depression, Hyperlipidemia,  HST-05/14/22- AHI 42.3/ hr, desaturation to 80%, body weight 220 lbs -Flonase, Saline naqsal spray,  CPAP auto 5-20/ [facility]    new order 07/23/22 Download compliance  77%, AHI 1.2/ hr Body weight today- Covid vax-3 Phizer Flu vax-had ENT / Dr Annalee Genta 09/04/22- Consider Inspire on return if unable to tolerate CPAP ------Struggling to get used to sleeping with CPAP machine  Download reviewed. Comfort issues and alternatives to CPAP discussed. Gave her ok to use her lorazepam with CPAP. Will refer to explore oral appliance.  ROS-see HPI   + = positive Constitutional:    weight loss, night sweats, fevers, chills, fatigue, lassitude. HEENT:    headaches, difficulty swallowing, tooth/dental problems, sore throat,       sneezing, itching, ear ache, nasal congestion, post nasal drip, snoring CV:    chest pain, orthopnea, PND, swelling in lower extremities, anasarca,                                   dizziness, palpitations Resp:   shortness of breath with exertion or at rest.                productive cough,   non-productive cough, coughing up of blood.              change in color of mucus.  wheezing.   Skin:    rash or lesions. GI:  No-   heartburn, indigestion, abdominal pain,  nausea, vomiting, diarrhea,                 change in bowel habits, loss of appetite GU: dysuria, change in color of urine, no urgency or frequency.   flank pain. MS:   joint pain, +stiffness, decreased range of motion, back pain. Neuro-     nothing unusual Psych:  change in mood or affect.  +depression or anxiety.   memory loss.  OBJ- Physical Exam General- Alert, Oriented, Affect-appropriate, Distress- none acute, +overweight Skin- rash-none, lesions- none, excoriation- none Lymphadenopathy- none Head- atraumatic            Eyes- Gross vision intact, PERRLA, conjunctivae and secretions clear            Ears- Hearing, canals-normal            Nose- Clear, no-Septal dev, mucus, polyps, erosion, perforation             Throat- Mallampati III-IV , mucosa clear , drainage- none, tonsils+, teeth+ Neck- flexible , trachea midline, no stridor , thyroid nl, carotid no bruit Chest - symmetrical excursion , unlabored           Heart/CV- RRR , no murmur , no gallop  , no rub, nl s1 s2                           -  JVD- none , edema- none, stasis changes- none, varices- none           Lung- clear to P&A, wheeze- none, cough- none , dullness-none, rub- none           Chest wall-  Abd-  Br/ Gen/ Rectal- Not done, not indicated Extrem- cyanosis- none, clubbing, none, atrophy- none, strength- nl Neuro- grossly intact to observation

## 2022-10-22 ENCOUNTER — Encounter: Payer: Self-pay | Admitting: Internal Medicine

## 2022-10-22 ENCOUNTER — Ambulatory Visit: Payer: 59 | Admitting: Internal Medicine

## 2022-10-22 VITALS — BP 130/68 | HR 65 | Ht 69.0 in | Wt 227.0 lb

## 2022-10-22 DIAGNOSIS — E0590 Thyrotoxicosis, unspecified without thyrotoxic crisis or storm: Secondary | ICD-10-CM | POA: Diagnosis not present

## 2022-10-22 DIAGNOSIS — G4733 Obstructive sleep apnea (adult) (pediatric): Secondary | ICD-10-CM | POA: Diagnosis not present

## 2022-10-22 NOTE — Patient Instructions (Signed)
Order- referral to Dr Irene Limbo, DDS     consider oral appliance to treat OSA  Ok to try your lorazepam if you are using CPAP that night  Order- DME [facility]- please reduce autopap range to 4-12  Please call if we can help

## 2022-11-03 ENCOUNTER — Encounter: Payer: Self-pay | Admitting: Internal Medicine

## 2022-11-03 NOTE — Assessment & Plan Note (Signed)
Being followed. I doubt this is impacting her sleep much.

## 2022-11-03 NOTE — Assessment & Plan Note (Signed)
Benefits from CPAP but comfort issues. Plan- reduce CPAP auto range to 4-12. Refer to Dr Toni Arthurs to consider oral appliance.

## 2022-12-13 ENCOUNTER — Other Ambulatory Visit: Payer: Self-pay | Admitting: Internal Medicine

## 2023-01-24 ENCOUNTER — Ambulatory Visit: Payer: 59 | Admitting: Internal Medicine

## 2023-02-09 NOTE — Progress Notes (Deleted)
HPI 23 yoF never smokerfollowed for OSA, complicated by  Sinusitis, Hyperthyroid, Depression, Hyperlipidemia,  HST-05/14/22- AHI 42.3/ hr, desaturation to 80%, body weight 220 lbs    =========================================================== .  10/22/22- 75 yoF never smokerfollowed for OSA, complicated by  Sinusitis, Hyperthyroid, Depression, Hyperlipidemia,  HST-05/14/22- AHI 42.3/ hr, desaturation to 80%, body weight 220 lbs -Flonase, Saline naqsal spray,  CPAP auto 5-20/ [facility]    new order 07/23/22 Download compliance  77%, AHI 1.2/ hr Body weight today- Covid vax-3 Phizer Flu vax-had ENT / Dr Wilburn Cornelia 09/04/22- Consider Inspire on return if unable to tolerate CPAP ------Struggling to get used to sleeping with CPAP machine  Download reviewed. Comfort issues and alternatives to CPAP discussed. Gave her ok to use her lorazepam with CPAP. Will refer to explore oral appliance.  02/11/23-36 yoF never smokerfollowed for OSA, complicated by  Sinusitis, Hyperthyroid, Depression, Hyperlipidemia,  -Lorazepam 0.5 CPAP auto 4-`2/ [facility]    new order 07/23/22 Download compliance  Body weight today- Covid vax-3 Phizer Flu vax-had Referred to Augustina Mood, DDS to consider OAP Has seen Dr Wilburn Cornelia ENT to discuss INspire   ROS-see HPI   + = positive Constitutional:    weight loss, night sweats, fevers, chills, fatigue, lassitude. HEENT:    headaches, difficulty swallowing, tooth/dental problems, sore throat,       sneezing, itching, ear ache, nasal congestion, post nasal drip, snoring CV:    chest pain, orthopnea, PND, swelling in lower extremities, anasarca,                                   dizziness, palpitations Resp:   shortness of breath with exertion or at rest.                productive cough,   non-productive cough, coughing up of blood.              change in color of mucus.  wheezing.   Skin:    rash or lesions. GI:  No-   heartburn, indigestion, abdominal pain, nausea, vomiting,  diarrhea,                 change in bowel habits, loss of appetite GU: dysuria, change in color of urine, no urgency or frequency.   flank pain. MS:   joint pain, +stiffness, decreased range of motion, back pain. Neuro-     nothing unusual Psych:  change in mood or affect.  +depression or anxiety.   memory loss.  OBJ- Physical Exam General- Alert, Oriented, Affect-appropriate, Distress- none acute, +overweight Skin- rash-none, lesions- none, excoriation- none Lymphadenopathy- none Head- atraumatic            Eyes- Gross vision intact, PERRLA, conjunctivae and secretions clear            Ears- Hearing, canals-normal            Nose- Clear, no-Septal dev, mucus, polyps, erosion, perforation             Throat- Mallampati III-IV , mucosa clear , drainage- none, tonsils+, teeth+ Neck- flexible , trachea midline, no stridor , thyroid nl, carotid no bruit Chest - symmetrical excursion , unlabored           Heart/CV- RRR , no murmur , no gallop  , no rub, nl s1 s2                           -  JVD- none , edema- none, stasis changes- none, varices- none           Lung- clear to P&A, wheeze- none, cough- none , dullness-none, rub- none           Chest wall-  Abd-  Br/ Gen/ Rectal- Not done, not indicated Extrem- cyanosis- none, clubbing, none, atrophy- none, strength- nl Neuro- grossly intact to observation

## 2023-02-11 ENCOUNTER — Ambulatory Visit: Payer: 59 | Admitting: Internal Medicine

## 2023-03-12 ENCOUNTER — Encounter: Payer: Self-pay | Admitting: Internal Medicine

## 2023-03-12 ENCOUNTER — Ambulatory Visit: Payer: 59 | Admitting: Internal Medicine

## 2023-03-12 VITALS — BP 110/70 | HR 76 | Ht 69.0 in | Wt 225.0 lb

## 2023-03-12 DIAGNOSIS — E0590 Thyrotoxicosis, unspecified without thyrotoxic crisis or storm: Secondary | ICD-10-CM

## 2023-03-12 LAB — TSH: TSH: 0.69 u[IU]/mL (ref 0.35–5.50)

## 2023-03-12 LAB — T4, FREE: Free T4: 0.91 ng/dL (ref 0.60–1.60)

## 2023-03-12 MED ORDER — METHIMAZOLE 5 MG PO TABS: 5.0000 mg | ORAL_TABLET | Freq: Every day | ORAL | 11 refills | Status: DC

## 2023-03-12 NOTE — Progress Notes (Signed)
Name: Dawn Barajas  MRN/ DOB: [id number], Sep 04, 1986    Age/ Sex: 37 y.o., female     PCP: Genia Del   Reason for Endocrinology Evaluation: Subclinical hyperthyroidism     Initial Endocrinology Clinic Visit: 04/11/2021    PATIENT IDENTIFIER: [name] is a 37 y.o., female with a past medical history of depression. She has followed with [facility] since 04/11/2021 for consultative assistance with management of her subclinical hyperthyroidism.   HISTORICAL SUMMARY: The patient was first diagnosed with subclinical hyperthyroidism in 2018 with a TSh nadir of 0.252 uIU/mL    She presented to Dr. Everardo All in 03/2021 and transition to my care by 05/2021  TRAb negative  Thyroid ultrasound 05/2021 Mildly enlarged and mildly heterogeneous thyroid gland  She was started on Methimazole 05/2021 but developed a rash (which was attributed to hair products and eczema ) and was stopped by 06/2021, was restarted on methimazole 09/2021 due to fatigue and irritability and a TSH of 0.14 u IU/mL   Mother with thyroid cancer at age 40   SUBJECTIVE:    Today (03/12/2023):  Dawn Barajas is here for subclinical hyperthyroidism .   She had a follow-up with pulmonology for OSA 09/2022, on CPAP  Denies nausea or abdominal pain Denies constipation or diarrhea  Denies tremors  Denies palpitations  Denies local neck swelling  LMP last month, regular   Methimazole 5 mg daily  HISTORY:  Past Medical History:  Past Medical History:  Diagnosis Date   Childhood asthma    Depression    History of chicken pox    IUD (intrauterine device) in place 2023   Lumbar degenerative disc disease    OSA (obstructive sleep apnea) 2023   Subclinical hyperthyroidism 2022   sees endo   Past Surgical History:  Past Surgical History:  Procedure Laterality Date   FOREARM FRACTURE SURGERY     age 55   WISDOM TOOTH EXTRACTION     Social History:  reports that she has never smoked.  She has never used smokeless tobacco. She reports current alcohol use. She reports that she does not use drugs. Family History:  Family History  Problem Relation Age of Onset   Diabetes Mother    Thyroid cancer Mother    Breast cancer Mother 11   Colon cancer Mother 49   Depression Mother    Obesity Mother    Hypertension Mother    Heart failure Mother    Skin cancer Father 25   Pancreatic cancer Father 4   Depression Sister    Stroke Paternal Grandmother 74     HOME MEDICATIONS: Allergies as of 03/12/2023   No Known Allergies      Medication List        Accurate as of March 12, 2023  2:41 PM. If you have any questions, ask your nurse or doctor.          cholecalciferol 25 MCG (1000 UNIT) tablet Commonly known as: VITAMIN D3 Take 1,000 Units by mouth daily.   escitalopram 10 MG tablet Commonly known as: LEXAPRO TAKE 1 TABLET BY MOUTH EVERY DAY   fluticasone 50 MCG/ACT nasal spray Commonly known as: FLONASE   IUD'S IU by Intrauterine route.   LORazepam 0.5 MG tablet Commonly known as: ATIVAN Take 1 tablet (0.5 mg total) by mouth 2 (two) times daily as needed for anxiety.   methimazole 5 MG tablet Commonly known as: TAPAZOLE Take 1 tablet (5 mg total) by mouth daily.  MULTIVITAMIN PO Take by mouth.          OBJECTIVE:   PHYSICAL EXAM: VS: BP 110/70 (BP Location: Left Arm, Patient Position: Sitting, Cuff Size: Large)   Pulse 76   Ht  (1.753 m)   Wt 225 lb (102.1 kg)   SpO2 96%   BMI 33.23 kg/m    EXAM: General: Pt appears well and is in NAD  Neck: General: Supple without adenopathy. Thyroid: Thyroid size normal.  No goiter or nodules appreciated.  Lungs: Clear with good BS bilat with no rales, rhonchi, or wheezes  Heart: Auscultation: RRR.  Abdomen: Normoactive bowel sounds, soft, nontender, without masses or organomegaly palpable  Extremities:  BL LE: No pretibial edema normal ROM and strength.  Mental Status: Judgment,  insight: Intact Orientation: Oriented to time, place, and person Mood and affect: No depression, anxiety, or agitation     DATA REVIEWED:   Latest Reference Range & Units 03/12/23 10:03  TSH 0.35 - 5.50 uIU/mL 0.69  T4,Free(Direct) 0.60 - 1.60 ng/dL 1.61     Latest Reference Range & Units 09/02/22 14:57  WBC 3.4 - 10.8 x10E3/uL 9.3  RBC 3.77 - 5.28 x10E6/uL 4.76  Hemoglobin 11.1 - 15.9 g/dL 09.6  HCT 04.5 - 40.9 % 43.4  MCV 79 - 97 fL 91  MCH 26.6 - 33.0 pg 29.0  MCHC 31.5 - 35.7 g/dL 81.1  RDW 91.4 - 78.2 % 12.6  Platelets 150 - 450 x10E3/uL 319    Latest Reference Range & Units 09/02/22 14:57  Sodium 134 - 144 mmol/L 140  Potassium 3.5 - 5.2 mmol/L 4.6  Chloride 96 - 106 mmol/L 98  CO2 20 - 29 mmol/L 23  Glucose 70 - 99 mg/dL 956 (H)  BUN 6 - 20 mg/dL 9  Creatinine 2.13 - 0.86 mg/dL 5.78  Calcium 8.7 - 46.9 mg/dL 9.4  BUN/Creatinine Ratio 9 - 23  12  eGFR >59 mL/min/1.73 109  Alkaline Phosphatase 44 - 121 IU/L 60  Albumin 3.9 - 4.9 g/dL 4.5  Albumin/Globulin Ratio 1.2 - 2.2  1.5  AST 0 - 40 IU/L 20  ALT 0 - 32 IU/L 22  Total Protein 6.0 - 8.5 g/dL 7.5  Total Bilirubin 0.0 - 1.2 mg/dL 0.6     ASSESSMENT / PLAN / RECOMMENDATIONS:   Subclinical hyperthyroid:   -Pt is clinically euthyroid  -Clinical scenario consistent with Graves'  -TRAb has come back negative -Thyroid ultrasound does not show any evidence of nodules, but I 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 based on age and ultrasound imaging of the thyroid gland with enlarged size and heterogeneity -The reason for treatment is due to chronicity of low TSH since  2018 - TFT' normal, no change     Medications   Continue Methimazole 5 mg daily    Follow-up in 6 months  Signed electronically by: Lyndle Herrlich, MD  [facility] Endocrin[facility]  [facility] [address] [place] [place], [place] [address] Phone: [phone] FAX: [phone]      CC: Jac Canavan, PA-C [address] [place] [address] Phone: [phone]  Fax: [phone]   Return to Endocrinology clinic as below: Future Appointments  Date Time Provider Department Center  09/08/2023 10:10 AM Jullia Mulligan, Konrad Dolores, MD [facility] None  09/09/2023  8:30 AM Tysinger, Kermit Balo, PA-C [facility]

## 2023-04-28 ENCOUNTER — Other Ambulatory Visit: Payer: Self-pay | Admitting: Family Medicine

## 2023-04-28 NOTE — Telephone Encounter (Signed)
Refill request last apt 09/02/22 next apt 09/09/23.

## 2023-08-08 ENCOUNTER — Other Ambulatory Visit: Payer: Self-pay | Admitting: Medical

## 2023-09-08 ENCOUNTER — Ambulatory Visit: Payer: 59 | Admitting: Internal Medicine

## 2023-09-08 VITALS — BP 114/76 | HR 69 | Ht 69.0 in | Wt 225.0 lb

## 2023-09-08 DIAGNOSIS — E0590 Thyrotoxicosis, unspecified without thyrotoxic crisis or storm: Secondary | ICD-10-CM | POA: Diagnosis not present

## 2023-09-08 LAB — TSH: TSH: 0.54 u[IU]/mL (ref 0.35–5.50)

## 2023-09-08 LAB — T4, FREE: Free T4: 0.81 ng/dL (ref 0.60–1.60)

## 2023-09-08 LAB — T3, FREE: T3, Free: 3.3 pg/mL (ref 2.3–4.2)

## 2023-09-08 MED ORDER — METHIMAZOLE 5 MG PO TABS: 5.0000 mg | ORAL_TABLET | Freq: Every day | ORAL | 3 refills | Status: DC

## 2023-09-08 NOTE — Progress Notes (Signed)
Name: Dawn Barajas  MRN/ DOB: [id number], 02-27-1986    Age/ Sex: 38 y.o., female     PCP: Genia Del   Reason for Endocrinology Evaluation: Subclinical hyperthyroidism     Initial Endocrinology Clinic Visit: 04/11/2021    PATIENT IDENTIFIER: Dawn Barajas is a 37 y.o., female with a past medical history of depression. She has followed with [facility] since 04/11/2021 for consultative assistance with management of her subclinical hyperthyroidism.   HISTORICAL SUMMARY: The patient was first diagnosed with subclinical hyperthyroidism in 2018 with a TSh nadir of 0.252 uIU/mL    She presented to Dr. Everardo All in 03/2021 and transition to my care by 05/2021  TRAb negative  Thyroid ultrasound 05/2021 Mildly enlarged and mildly heterogeneous thyroid gland  She was started on Methimazole 05/2021 but developed a rash (which was attributed to hair products and eczema ) and was stopped by 06/2021, was restarted on methimazole 09/2021 due to fatigue and irritability and a TSH of 0.14 u IU/mL   Mother with thyroid cancer at age 90   SUBJECTIVE:    Today (09/08/2023):  Dawn Barajas is here for subclinical hyperthyroidism .   She continues to follow-up with pulmonary for OSA  Weight has been stable Denies local neck swelling  Denies palpitations  Denies  diarrhea  but has noted softer than normal  Denies tremors  Denies hair loss  Denies eye symptoms   Methimazole 5 mg daily  HISTORY:  Past Medical History:  Past Medical History:  Diagnosis Date   Childhood asthma    Depression    History of chicken pox    IUD (intrauterine device) in place 2023   Lumbar degenerative disc disease    OSA (obstructive sleep apnea) 2023   Subclinical hyperthyroidism 2022   sees endo   Past Surgical History:  Past Surgical History:  Procedure Laterality Date   FOREARM FRACTURE SURGERY     age 4   WISDOM TOOTH EXTRACTION     Social History:  reports that she has  never smoked. She has never used smokeless tobacco. She reports current alcohol use. She reports that she does not use drugs. Family History:  Family History  Problem Relation Age of Onset   Diabetes Mother    Thyroid cancer Mother    Breast cancer Mother 85   Colon cancer Mother 68   Depression Mother    Obesity Mother    Hypertension Mother    Heart failure Mother    Skin cancer Father 79   Pancreatic cancer Father 36   Depression Sister    Stroke Paternal Grandmother 55     HOME MEDICATIONS: Allergies as of 09/08/2023   No Known Allergies      Medication List        Accurate as of September 08, 2023 10:16 AM. If you have any questions, ask your nurse or doctor.          STOP taking these medications    cholecalciferol 25 MCG (1000 UNIT) tablet Commonly known as: VITAMIN D3 Stopped by: Johnney Ou Jhony Antrim   fluticasone 50 MCG/ACT nasal spray Commonly known as: FLONASE Stopped by: Johnney Ou Ercie Eliasen   LORazepam 0.5 MG tablet Commonly known as: ATIVAN Stopped by: Johnney Ou Anagha Loseke       TAKE these medications    escitalopram 10 MG tablet Commonly known as: LEXAPRO TAKE 1 TABLET BY MOUTH EVERY DAY   IUD'S IU by Intrauterine route.   methimazole 5 MG  tablet Commonly known as: TAPAZOLE Take 1 tablet (5 mg total) by mouth daily.   MULTIVITAMIN PO Take by mouth.          OBJECTIVE:   PHYSICAL EXAM: VS: BP 114/76 (BP Location: Left Arm, Patient Position: Sitting, Cuff Size: Large)   Pulse 69   Ht 5\' 9"  (1.753 m)   Wt 225 lb (102.1 kg)   SpO2 99%   BMI 33.23 kg/m    EXAM: General: Pt appears well and is in NAD  Neck: General: Supple without adenopathy. Thyroid: Thyroid size normal.  No goiter or nodules appreciated.  Lungs: Clear with good BS bilat with no rales, rhonchi, or wheezes  Heart: Auscultation: RRR.  Abdomen: Normoactive bowel sounds, soft, nontender, without masses or organomegaly palpable  Extremities:  BL LE: No  pretibial edema normal ROM and strength.  Mental Status: Judgment, insight: Intact Orientation: Oriented to time, place, and person Mood and affect: No depression, anxiety, or agitation     DATA REVIEWED:  Latest Reference Range & Units 09/08/23 10:34  TSH 0.35 - 5.50 uIU/mL 0.54  Triiodothyronine,Free,Serum 2.3 - 4.2 pg/mL 3.3  T4,Free(Direct) 0.60 - 1.60 ng/dL 2.95        Latest Reference Range & Units 09/02/22 14:57  WBC 3.4 - 10.8 x10E3/uL 9.3  RBC 3.77 - 5.28 x10E6/uL 4.76  Hemoglobin 11.1 - 15.9 g/dL 28.4  HCT 13.2 - 44.0 % 43.4  MCV 79 - 97 fL 91  MCH 26.6 - 33.0 pg 29.0  MCHC 31.5 - 35.7 g/dL 10.2  RDW 72.5 - 36.6 % 12.6  Platelets 150 - 450 x10E3/uL 319    Latest Reference Range & Units 09/02/22 14:57  Sodium 134 - 144 mmol/L 140  Potassium 3.5 - 5.2 mmol/L 4.6  Chloride 96 - 106 mmol/L 98  CO2 20 - 29 mmol/L 23  Glucose 70 - 99 mg/dL 440 (H)  BUN 6 - 20 mg/dL 9  Creatinine 3.47 - 4.25 mg/dL 9.56  Calcium 8.7 - 38.7 mg/dL 9.4  BUN/Creatinine Ratio 9 - 23  12  eGFR >59 mL/min/1.73 109  Alkaline Phosphatase 44 - 121 IU/L 60  Albumin 3.9 - 4.9 g/dL 4.5  Albumin/Globulin Ratio 1.2 - 2.2  1.5  AST 0 - 40 IU/L 20  ALT 0 - 32 IU/L 22  Total Protein 6.0 - 8.5 g/dL 7.5  Total Bilirubin 0.0 - 1.2 mg/dL 0.6     ASSESSMENT / PLAN / RECOMMENDATIONS:   Hyperthyroidism:   -Pt is clinically euthyroid  -Clinical scenario consistent with Graves'  -TRAb has come back negative -Thyroid ultrasound does not show any evidence of nodules, but I 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 based on age and ultrasound imaging of the thyroid gland with enlarged size and heterogeneity -The reason for treatment is due to chronicity of low TSH since  2018 -TFTs remain normal    Medications   Continue Methimazole 5 mg daily    Follow-up in 6 months  Signed electronically by: Lyndle Herrlich, MD  [facility] Endocrinology  [facility] [address] [place] [place], [place] [address] Phone: [phone] FAX: [phone]      CC: Jac Canavan, PA-C [address] [place] [address] Phone: [phone]  Fax: [phone]   Return to Endocrinology clinic as below: Future Appointments  Date Time Provider Department Center  09/09/2023  8:30 AM Tysinger, Kermit Balo, PA-C [facility]

## 2023-09-09 ENCOUNTER — Encounter: Payer: Self-pay | Admitting: Medical

## 2023-09-09 ENCOUNTER — Ambulatory Visit: Payer: 59 | Admitting: Medical

## 2023-09-09 VITALS — BP 120/80 | HR 64 | Ht 69.5 in | Wt 222.4 lb

## 2023-09-09 DIAGNOSIS — E782 Mixed hyperlipidemia: Secondary | ICD-10-CM

## 2023-09-09 DIAGNOSIS — Z23 Encounter for immunization: Secondary | ICD-10-CM | POA: Diagnosis not present

## 2023-09-09 DIAGNOSIS — E0590 Thyrotoxicosis, unspecified without thyrotoxic crisis or storm: Secondary | ICD-10-CM | POA: Diagnosis not present

## 2023-09-09 DIAGNOSIS — Z7185 Encounter for immunization safety counseling: Secondary | ICD-10-CM

## 2023-09-09 DIAGNOSIS — E559 Vitamin D deficiency, unspecified: Secondary | ICD-10-CM | POA: Diagnosis not present

## 2023-09-09 DIAGNOSIS — F32A Depression, unspecified: Secondary | ICD-10-CM

## 2023-09-09 DIAGNOSIS — R41840 Attention and concentration deficit: Secondary | ICD-10-CM

## 2023-09-09 DIAGNOSIS — Z131 Encounter for screening for diabetes mellitus: Secondary | ICD-10-CM | POA: Diagnosis not present

## 2023-09-09 DIAGNOSIS — Z0000 Encounter for general adult medical examination without abnormal findings: Secondary | ICD-10-CM | POA: Diagnosis not present

## 2023-09-09 DIAGNOSIS — G4733 Obstructive sleep apnea (adult) (pediatric): Secondary | ICD-10-CM

## 2023-09-09 NOTE — Progress Notes (Signed)
Subjective: Chief Complaint  Patient presents with   Annual Exam    Fasting cpe, possible ADHD,   Medical team: Dr. Mendel Ryder, endocrinology Dr. Jetty Duhamel, pulmonology Sees eye doctor and dentist Dr. Rhoderick Moody, gynecology, [facility] Tollie Eth PA, [facility]  Concerns: Has concerns about ADD.  Her son got diagnosed this past year and in his appointment she realizes she has some similar symptoms.  She has problems with being around, problems with focusing on certain task, and this takes place both at home and at work.  Sometimes she gets irritable and lashes out.  Her patient special needs short sometimes.  She wants to be evaluated for ADD.  Still sees endocrinology for subclinical hyperthyroidism.  She has been on methimazole for 2 years now and does pretty good with this.  History of sleep apnea but not tolerating CPAP.  Last sleep study June 2023.  Still compliant with Lexapro.  Reviewed their medical, surgical, family, social, medication, and allergy history and updated chart as appropriate.  Past Medical History:  Diagnosis Date   Childhood asthma    Depression    History of chicken pox    IUD (intrauterine device) in place 2023   Lumbar degenerative disc disease    OSA (obstructive sleep apnea) 2023   Sleep apnea    Subclinical hyperthyroidism 2022   sees endo    Family History  Problem Relation Age of Onset   Diabetes Mother    Thyroid cancer Mother    Breast cancer Mother 38   Colon cancer Mother 77   Depression Mother    Obesity Mother    Hypertension Mother    Heart failure Mother    Arthritis Mother    Cancer Mother    Heart disease Mother    Miscarriages / Stillbirths Mother    Skin cancer Father 109   Pancreatic cancer Father 66   Cancer Father    Depression Sister    Stroke Paternal Grandmother 42   Cancer Maternal Aunt      Current Outpatient Medications:    escitalopram (LEXAPRO) 10 MG tablet, TAKE 1 TABLET  BY MOUTH EVERY DAY, Disp: 30 tablet, Rfl: 0   IUD'S IU, by Intrauterine route., Disp: , Rfl:    methimazole (TAPAZOLE) 5 MG tablet, Take 1 tablet (5 mg total) by mouth daily., Disp: 90 tablet, Rfl: 3   Multiple Vitamin (MULTIVITAMIN PO), Take by mouth., Disp: , Rfl:    Multiple Vitamins-Minerals (VITAMIN D3 COMPLETE PO), Take by mouth., Disp: , Rfl:   No Known Allergies  Review of Systems  Constitutional:  Negative for chills, fever, malaise/fatigue and weight loss.  HENT:  Negative for congestion, ear pain, hearing loss, sore throat and tinnitus.   Eyes:  Negative for blurred vision, pain and redness.  Respiratory:  Negative for cough, hemoptysis and shortness of breath.   Cardiovascular:  Negative for chest pain, palpitations, orthopnea, claudication and leg swelling.  Gastrointestinal:  Negative for abdominal pain, blood in stool, constipation, diarrhea, nausea and vomiting.  Genitourinary:  Negative for dysuria, flank pain, frequency, hematuria and urgency.  Musculoskeletal:  Negative for falls, joint pain and myalgias.  Skin:  Negative for itching and rash.  Neurological:  Negative for dizziness, tingling, speech change, weakness and headaches.  Endo/Heme/Allergies:  Negative for polydipsia. Does not bruise/bleed easily.  Psychiatric/Behavioral:  Negative for depression and memory loss. The patient is nervous/anxious. The patient does not have insomnia.         09/09/2023  9:44 AM 09/02/2022    2:29 PM 02/14/2022    3:55 PM 08/22/2021    9:53 AM 04/18/2021   11:19 AM  Depression screen PHQ 2/9  Decreased Interest 1 0 0 0 0  Down, Depressed, Hopeless 0 0 0 0 0  PHQ - 2 Score 1 0 0 0 0  Altered sleeping 1 3     Tired, decreased energy 2 1     Change in appetite 1 0     Feeling bad or failure about yourself  0 0     Trouble concentrating 2 0     Moving slowly or fidgety/restless 1 0     Suicidal thoughts 0 0     PHQ-9 Score 8 4     Difficult doing work/chores Somewhat  difficult Not difficult at all          Objective:  BP 120/80   Pulse 64   Ht 5' 9.5" (1.765 m)   Wt 222 lb 6.4 oz (100.9 kg)   BMI 32.37 kg/m   General appearance: alert, no distress, WD/WN, Caucasian female Skin: unremarkable lesions, scattered macules HEENT: normocephalic, conjunctiva/corneas normal, sclerae anicteric, PERRLA, EOMi, nares patent, no discharge or erythema, pharynx normal Oral cavity: MMM, tongue normal, teeth normal Neck: supple, no lymphadenopathy, no thyromegaly, no masses, normal ROM, no bruits Chest: non tender, normal shape and expansion Heart: RRR, normal S1, S2, no murmurs Lungs: CTA bilaterally, no wheezes, rhonchi, or rales Abdomen: +bs, soft, non tender, non distended, no masses, no hepatomegaly, no splenomegaly, no bruits Back: non tender, normal ROM, no scoliosis Musculoskeletal: upper extremities non tender, no obvious deformity, normal ROM throughout, lower extremities non tender, no obvious deformity, normal ROM throughout Extremities: no edema, no cyanosis, no clubbing Pulses: 2+ symmetric, upper and lower extremities, normal cap refill Neurological: alert, oriented x 3, CN2-12 intact, strength normal upper extremities and lower extremities, sensation normal throughout, DTRs 2+ throughout, no cerebellar signs, gait normal Psychiatric: normal affect, behavior normal, pleasant  Breast/gyn/rectal - deferred to gynecology     Assessment and Plan :   Encounter Diagnoses  Name Primary?   Encounter for health maintenance examination in adult Yes   Mixed hyperlipidemia    OSA (obstructive sleep apnea)    Subclinical hyperthyroidism    Vitamin D deficiency    Screening for diabetes mellitus    Vaccine counseling    Attention and concentration deficit    Depression, unspecified depression type      This visit was a preventative care visit, also known as wellness visit or routine physical.   Topics typically include healthy lifestyle, diet,  exercise, preventative care, vaccinations, sick and well care, proper use of emergency dept and after hours care, as well as other concerns.     Recommendations: Continue to return yearly for your annual wellness and preventative care visits.  This gives [place] a chance to discuss healthy lifestyle, exercise, vaccinations, review your chart record, and perform screenings where appropriate.  I recommend you see your eye doctor yearly for routine vision care.  I recommend you see your dentist yearly for routine dental care including hygiene visits twice yearly.  See your gynecologist yearly for routine gynecological care.   Vaccination recommendations were reviewed Immunization History  Administered Date(s) Administered   Influenza Split 09/12/2016   Influenza, Seasonal, Injecte, Preservative Fre 09/09/2023   Influenza,inj,Quad PF,6+ Mos 08/22/2021, 09/02/2022   PFIZER(Purple Top)SARS-COV-2 Vaccination 02/17/2020, 03/24/2020, 11/22/2020   Tdap 09/25/2016    Counseled on the influenza  virus vaccine.  Vaccine information sheet given.  Influenza vaccine given after consent obtained.  Declines covid vaccine.  Screening for cancer: Colon cancer screening: Age 65  Breast cancer screening: You should perform a self breast exam monthly.   We reviewed recommendations for regular mammograms and breast cancer screening.  Cervical cancer screening: We reviewed recommendations for pap smear screening.   Skin cancer screening: Check your skin regularly for new changes, growing lesions, or other lesions of concern Come in for evaluation if you have skin lesions of concern.  Lung cancer screening: If you have a greater than 20 pack year history of tobacco use, then you may qualify for lung cancer screening with a chest CT scan.   Please call your insurance company to inquire about coverage for this test.  We currently don't have screenings for other cancers besides breast, cervical, colon, and  lung cancers.  If you have a strong family history of cancer or have other cancer screening concerns, please let me know.    Bone health: Get at least 150 minutes of aerobic exercise weekly Get weight bearing exercise at least once weekly Bone density test:  A bone density test is an imaging test that uses a type of X-ray to measure the amount of calcium and other minerals in your bones. The test may be used to diagnose or screen you for a condition that causes weak or thin bones (osteoporosis), predict your risk for a broken bone (fracture), or determine how well your osteoporosis treatment is working. The bone density test is recommended for females 65 and older, or females or males <65 if certain risk factors such as thyroid disease, long term use of steroids such as for asthma or rheumatological issues, vitamin D deficiency, estrogen deficiency, family history of osteoporosis, self or family history of fragility fracture in first degree relative.    Heart health: Get at least 150 minutes of aerobic exercise weekly Limit alcohol It is important to maintain a healthy blood pressure and healthy cholesterol numbers  Heart disease screening: Screening for heart disease includes screening for blood pressure, fasting lipids, glucose/diabetes screening, BMI height to weight ratio, reviewed of smoking status, physical activity, and diet.    Goals include blood pressure 120/80 or less, maintaining a healthy lipid/cholesterol profile, preventing diabetes or keeping diabetes numbers under good control, not smoking or using tobacco products, exercising most days per week or at least 150 minutes per week of exercise, and eating healthy variety of fruits and vegetables, healthy oils, and avoiding unhealthy food choices like fried food, fast food, high sugar and high cholesterol foods.    Other tests may possibly include EKG test, CT coronary calcium score, echocardiogram, exercise treadmill stress test.     Medical care options: I recommend you continue to seek care here first for routine care.  We try really hard to have available appointments Monday through Friday daytime hours for sick visits, acute visits, and physicals.  Urgent care should be used for after hours and weekends for significant issues that cannot wait till the next day.  The emergency department should be used for significant potentially life-threatening emergencies.  The emergency department is expensive, can often have long wait times for less significant concerns, so try to utilize primary care, urgent care, or telemedicine when possible to avoid unnecessary trips to the emergency department.  Virtual visits and telemedicine have been introduced since the pandemic started in 2020, and can be convenient ways to receive medical care.  We  offer virtual appointments as well to assist you in a variety of options to seek medical care.   Advanced Directives: I recommend you consider completing a Health Care Power of Attorney and Living Will.   These documents respect your wishes and help alleviate burdens on your loved ones if you were to become terminally ill or be in a position to need those documents enforced.    You can complete Advanced Directives yourself, have them notarized, then have copies made for our office, for you and for anybody you feel should have them in safe keeping.  Or, you can have an attorney prepare these documents.   If you haven't updated your Last Will and Testament in a while, it may be worthwhile having an attorney prepare these documents together and save on some costs.       Separate significant issues discussed: Sleep apnea-not currently on CPAP therapy.  Counseled on possibly trying again with CPAP.  Counseled on losing weight.  BMI 32 -counseled on efforts to lose weight through healthy diet and exercise.  Subclinical hyperthyroidism-managed by endocrinology  Vitamin D deficiency-updated labs  today.  She has taken over-the-counter supplement.  Mixed hyperlipidemia-updated labs today  Concern for focus and attention, ADHD questionnaire and PHQ-9 questionnaires reviewed today.  We discussed possible next steps which could include counseling, medication.   Anmarie was seen today for annual exam.  Diagnoses and all orders for this visit:  Encounter for health maintenance examination in adult -     Comprehensive metabolic panel -     CBC -     Hemoglobin A1c -     Lipid panel -     VITAMIN D 25 Hydroxy (Vit-D Deficiency, Fractures)  Mixed hyperlipidemia -     Lipid panel  OSA (obstructive sleep apnea)  Subclinical hyperthyroidism  Vitamin D deficiency -     VITAMIN D 25 Hydroxy (Vit-D Deficiency, Fractures)  Screening for diabetes mellitus -     Hemoglobin A1c  Vaccine counseling -     Flu vaccine trivalent PF, 6mos and older(Flulaval,Afluria,Fluarix,Fluzone)  Attention and concentration deficit  Depression, unspecified depression type    Follow-up pending labs, yearly for physical

## 2023-09-10 ENCOUNTER — Other Ambulatory Visit: Payer: Self-pay | Admitting: Medical

## 2023-09-10 LAB — COMPREHENSIVE METABOLIC PANEL
ALT: 21 IU/L (ref 0–32)
AST: 19 IU/L (ref 0–40)
Albumin: 4.7 g/dL (ref 3.9–4.9)
Alkaline Phosphatase: 59 IU/L (ref 44–121)
BUN/Creatinine Ratio: 16 (ref 9–23)
BUN: 12 mg/dL (ref 6–20)
Bilirubin Total: 0.5 mg/dL (ref 0.0–1.2)
CO2: 24 mmol/L (ref 20–29)
Calcium: 9.6 mg/dL (ref 8.7–10.2)
Chloride: 104 mmol/L (ref 96–106)
Creatinine, Ser: 0.75 mg/dL (ref 0.57–1.00)
Globulin, Total: 2.7 g/dL (ref 1.5–4.5)
Glucose: 98 mg/dL (ref 70–99)
Potassium: 5.4 mmol/L — ABNORMAL HIGH (ref 3.5–5.2)
Sodium: 142 mmol/L (ref 134–144)
Total Protein: 7.4 g/dL (ref 6.0–8.5)
eGFR: 105 mL/min/{1.73_m2} (ref >59)

## 2023-09-10 LAB — LIPID PANEL
Cholesterol, Total: 236 mg/dL — ABNORMAL HIGH (ref 100–199)
HDL: 39 mg/dL — ABNORMAL LOW (ref >39)
LDL CALC COMMENT:: 6.1 ratio — ABNORMAL HIGH (ref 0.0–4.4)
LDL Chol Calc (NIH): 171 mg/dL — ABNORMAL HIGH (ref 0–99)
Triglycerides: 141 mg/dL (ref 0–149)
VLDL Cholesterol Cal: 26 mg/dL (ref 5–40)

## 2023-09-10 LAB — CBC
Hematocrit: 44.3 % (ref 34.0–46.6)
Hemoglobin: 14.3 g/dL (ref 11.1–15.9)
MCH: 29.4 pg (ref 26.6–33.0)
MCHC: 32.3 g/dL (ref 31.5–35.7)
MCV: 91 fL (ref 79–97)
Platelets: 283 10*3/uL (ref 150–450)
RBC: 4.86 x10E6/uL (ref 3.77–5.28)
RDW: 12.5 % (ref 11.7–15.4)
WBC: 5.7 10*3/uL (ref 3.4–10.8)

## 2023-09-10 LAB — HEMOGLOBIN A1C
Est. average glucose Bld gHb Est-mCnc: 123 mg/dL
Hgb A1c MFr Bld: 5.9 % — ABNORMAL HIGH (ref 4.8–5.6)

## 2023-09-10 LAB — VITAMIN D 25 HYDROXY (VIT D DEFICIENCY, FRACTURES): Vit D, 25-Hydroxy: 47.9 ng/mL (ref 30.0–100.0)

## 2023-09-10 MED ORDER — ESCITALOPRAM OXALATE 10 MG PO TABS: 10.0000 mg | ORAL_TABLET | Freq: Every day | ORAL | 3 refills | Status: DC

## 2023-09-10 NOTE — Progress Notes (Signed)
Results sent through MyChart

## 2023-09-11 ENCOUNTER — Ambulatory Visit: Payer: 59 | Admitting: Internal Medicine

## 2023-11-26 DIAGNOSIS — F909 Attention-deficit hyperactivity disorder, unspecified type: Secondary | ICD-10-CM

## 2023-11-26 HISTORY — DX: Attention-deficit hyperactivity disorder, unspecified type: F90.9

## 2024-01-26 ENCOUNTER — Ambulatory Visit
Admission: EM | Admit: 2024-01-26 | Discharge: 2024-01-26 | Disposition: A | Discharge status: Home / Self Care | Attending: Emergency Medicine | Admitting: Emergency Medicine

## 2024-01-26 DIAGNOSIS — J0100 Acute maxillary sinusitis, unspecified: Secondary | ICD-10-CM | POA: Diagnosis not present

## 2024-01-26 MED ORDER — AMOXICILLIN-POT CLAVULANATE 875-125 MG PO TABS: 1.0000 | ORAL_TABLET | Freq: Two times a day (BID) | ORAL | 0 refills | Status: DC

## 2024-01-26 NOTE — Discharge Instructions (Addendum)
 Take the Augmentin as directed.  Follow up with your primary care provider if your symptoms are not improving.

## 2024-01-26 NOTE — ED Provider Notes (Signed)
 Dawn Barajas    CSN: [id number] Arrival date & time: 01/26/24  1122      History   Chief Complaint Chief Complaint  Patient presents with   Cough   Otalgia   Fatigue    HPI Dawn Barajas is a 38 y.o. female.  Patient presents with 8-day history of congestion and cough.  Her symptoms are getting worse.  She has some left ear discomfort and popping.  No known fever.  Multiple OTC treatments attempted without relief.  The history is provided by the patient and medical records.    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   Mixed hyperlipidemia 08/22/2021   Seborrheic dermatitis 07/10/2021   Subclinical hyperthyroidism 07/09/2021   Rash 05/31/2021   Vitamin D deficiency 05/31/2021   Low TSH level 02/13/2021   Family history of thyroid cancer 02/13/2021   Elevated ALT measurement 04/18/2020   Depression 03/02/2020   Fatigue 03/02/2020   Menorrhagia with regular cycle 03/02/2020   Sinusitis 03/02/2018   Conjunctivitis 03/02/2018   Routine general medical examination at a health care facility 05/23/2017    Past Surgical History:  Procedure Laterality Date   FOREARM FRACTURE SURGERY     age 66   FRACTURE SURGERY     WISDOM TOOTH EXTRACTION      OB History   No obstetric history on file.      Home Medications    Prior to Admission medications   Medication Sig Start Date End Date Taking? Authorizing Provider   amoxicillin-clavulanate (AUGMENTIN) 875-125 MG tablet Take 1 tablet by mouth every 12 (twelve) hours. 01/26/24  Yes Mickie Bail, NP  escitalopram (LEXAPRO) 10 MG tablet Take 1 tablet (10 mg total) by mouth daily. 09/10/23  Yes Tysinger, Kermit Balo, PA-C  IUD'S IU by Intrauterine route.   Yes [provider]  methimazole (TAPAZOLE) 5 MG tablet Take 1 tablet (5 mg total) by mouth daily. 09/08/23  Yes Shamleffer, Konrad Dolores, MD  Multiple Vitamin (MULTIVITAMIN PO) Take by mouth.   Yes [provider]  Multiple Vitamins-Minerals (VITAMIN D3 COMPLETE PO) Take by mouth.   Yes [provider]    Family History Family History  Problem Relation Age of Onset   Diabetes Mother    Thyroid cancer Mother    Breast cancer Mother 46   Colon cancer Mother 11   Depression Mother    Obesity Mother    Hypertension Mother    Heart failure Mother    Arthritis Mother    Cancer Mother    Heart disease Mother    Miscarriages / [place] Mother    Skin cancer Father 68   Pancreatic cancer Father 29   Cancer Father    Depression Sister    Stroke Paternal Grandmother 34   Cancer Maternal Aunt     Social History Social History   Tobacco Use   Smoking status: Never  Smokeless tobacco: Never  Vaping Use   Vaping status: Never Used  Substance Use Topics   Alcohol use: Yes    Alcohol/week: 1.0 standard drink of alcohol    Types: 1 Cans of beer per week    Comment: one a week   Drug use: No     Allergies   Patient has no known allergies.   Review of Systems Review of Systems  Constitutional:  Negative for chills and fever.  HENT:  Positive for congestion, ear pain, postnasal drip, rhinorrhea and sinus pressure. Negative for sore throat.   Respiratory:  Positive for cough. Negative for shortness of breath.      Physical Exam Triage Vital Signs ED Triage Vitals  Encounter Vitals Group     BP 01/26/24 1142 121/80     Systolic BP Percentile --       Diastolic BP Percentile --      Pulse Rate 01/26/24 1142 62     Resp 01/26/24 1142 18     Temp 01/26/24 1142 97.9 F (36.6 C)     Temp src --      SpO2 01/26/24 1142 98 %     Weight --      Height --      Head Circumference --      Peak Flow --      Pain Score 01/26/24 1216 3     Pain Loc --      Pain Education --      Exclude from Growth Chart --    No data found.  Updated Vital Signs BP 121/80   Pulse 62   Temp 97.9 F (36.6 C)   Resp 18   SpO2 98%   Visual Acuity Right Eye Distance:   Left Eye Distance:   Bilateral Distance:    Right Eye Near:   Left Eye Near:    Bilateral Near:     Physical Exam Constitutional:      General: She is not in acute distress. HENT:     Right Ear: Tympanic membrane normal.     Left Ear: Tympanic membrane normal.     Nose: Congestion and rhinorrhea present.     Mouth/Throat:     Mouth: Mucous membranes are moist.     Pharynx: Oropharynx is clear.  Cardiovascular:     Rate and Rhythm: Normal rate and regular rhythm.     Heart sounds: Normal heart sounds.  Pulmonary:     Effort: Pulmonary effort is normal. No respiratory distress.     Breath sounds: Normal breath sounds.  Neurological:     Mental Status: She is alert.      UC Treatments / Results  Labs (all labs ordered are listed, but only abnormal results are displayed) Labs Reviewed - No data to display  EKG   Radiology No results found.  Procedures Procedures (including critical care time)  Medications Ordered in UC Medications - No data to display  Initial Impression / Assessment and Plan / UC Course  I have reviewed the triage vital signs and the nursing notes.  Pertinent labs & imaging results that were available during my care of the patient were reviewed by me and considered in my medical decision making (see chart for details).    Acute sinusitis.  Patient has been symptomatic for 8 days and is not improving with OTC treatment.  Treating today with  Augmentin.  Tylenol or ibuprofen as needed.  Plain Mucinex as needed.  Instructed her to  follow-up with her PCP if she is not improving.  She agrees to plan of care.  Final Clinical Impressions(s) / UC Diagnoses   Final diagnoses:  Acute non-recurrent maxillary sinusitis     Discharge Instructions      Take the Augmentin as directed.  Follow-up with your primary care provider if your symptoms are not improving.      ED Prescriptions     Medication Sig Dispense Auth. Provider   amoxicillin-clavulanate (AUGMENTIN) 875-125 MG tablet Take 1 tablet by mouth every 12 (twelve) hours. 14 tablet Mickie Bail, NP      PDMP not reviewed this encounter.   Mickie Bail, NP 01/26/24 1246

## 2024-01-26 NOTE — ED Triage Notes (Signed)
 Pt reports viral s/s starting 8 days ago, worsening symptoms incl cough, congestion, bilateral ear pain when blowing nose, L ear sounds squeaky and will clear up after popping. Has balance issues after blowing nose. Generalized body pain, fatigue. Unsure of fever.   Has tried multiple OTC meds/tx.

## 2024-03-08 ENCOUNTER — Ambulatory Visit: Payer: 59 | Admitting: Internal Medicine

## 2024-03-08 VITALS — BP 120/70 | HR 73 | Ht 69.5 in | Wt 227.0 lb

## 2024-03-08 DIAGNOSIS — R6889 Other general symptoms and signs: Secondary | ICD-10-CM | POA: Diagnosis not present

## 2024-03-08 DIAGNOSIS — E0590 Thyrotoxicosis, unspecified without thyrotoxic crisis or storm: Secondary | ICD-10-CM | POA: Diagnosis not present

## 2024-03-08 LAB — TSH: TSH: 1 m[IU]/L

## 2024-03-08 LAB — LUTEINIZING HORMONE: LH: 9 m[IU]/mL

## 2024-03-08 LAB — T4, FREE: Free T4: 1.1 ng/dL (ref 0.8–1.8)

## 2024-03-08 LAB — ESTRADIOL: Estradiol: 71 pg/mL

## 2024-03-08 LAB — FOLLICLE STIMULATING HORMONE: FSH: 8.7 m[IU]/mL
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
## 2024-03-09 ENCOUNTER — Encounter: Payer: Self-pay | Admitting: Internal Medicine

## 2024-03-09 MED ORDER — METHIMAZOLE 5 MG PO TABS: 5.0000 mg | ORAL_TABLET | Freq: Every day | ORAL | 3 refills | Status: AC

## 2024-04-30 ENCOUNTER — Other Ambulatory Visit: Payer: Self-pay

## 2024-04-30 ENCOUNTER — Emergency Department (HOSPITAL_COMMUNITY)
Admission: EM | Admit: 2024-04-30 | Discharge: 2024-04-30 | Disposition: A | Discharge status: Home / Self Care | Payer: Worker's Compensation | Attending: Emergency Medicine | Admitting: Emergency Medicine

## 2024-04-30 ENCOUNTER — Encounter (HOSPITAL_COMMUNITY): Payer: Self-pay | Admitting: *Deleted

## 2024-04-30 ENCOUNTER — Emergency Department (HOSPITAL_COMMUNITY): Payer: Worker's Compensation

## 2024-04-30 DIAGNOSIS — S60512A Abrasion of left hand, initial encounter: Secondary | ICD-10-CM | POA: Diagnosis not present

## 2024-04-30 DIAGNOSIS — S60511A Abrasion of right hand, initial encounter: Secondary | ICD-10-CM | POA: Diagnosis not present

## 2024-04-30 DIAGNOSIS — S40811A Abrasion of right upper arm, initial encounter: Secondary | ICD-10-CM | POA: Diagnosis not present

## 2024-04-30 DIAGNOSIS — S80211A Abrasion, right knee, initial encounter: Secondary | ICD-10-CM | POA: Diagnosis not present

## 2024-04-30 DIAGNOSIS — W19XXXA Unspecified fall, initial encounter: Secondary | ICD-10-CM

## 2024-04-30 DIAGNOSIS — S4991XA Unspecified injury of right shoulder and upper arm, initial encounter: Secondary | ICD-10-CM | POA: Diagnosis present

## 2024-04-30 DIAGNOSIS — M79601 Pain in right arm: Secondary | ICD-10-CM

## 2024-04-30 DIAGNOSIS — S50312A Abrasion of left elbow, initial encounter: Secondary | ICD-10-CM | POA: Insufficient documentation

## 2024-04-30 DIAGNOSIS — S0081XA Abrasion of other part of head, initial encounter: Secondary | ICD-10-CM | POA: Insufficient documentation
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
## 2024-07-14 ENCOUNTER — Other Ambulatory Visit: Payer: Self-pay | Admitting: Medical

## 2024-07-14 NOTE — Telephone Encounter (Signed)
 Please advise. Last OV 03/08/24.

## 2024-07-16 ENCOUNTER — Other Ambulatory Visit: Payer: Self-pay | Admitting: Medical

## 2024-07-16 MED ORDER — ESCITALOPRAM OXALATE 10 MG PO TABS: 10.0000 mg | ORAL_TABLET | Freq: Every day | ORAL | 0 refills | Status: AC

## 2024-09-13 ENCOUNTER — Encounter: Payer: Self-pay | Admitting: Medical

## 2024-09-13 ENCOUNTER — Ambulatory Visit: Payer: 59 | Admitting: Medical

## 2024-09-13 VITALS — BP 110/70 | HR 76 | Ht 69.0 in | Wt 230.2 lb

## 2024-09-13 DIAGNOSIS — R232 Flushing: Secondary | ICD-10-CM

## 2024-09-13 DIAGNOSIS — E785 Hyperlipidemia, unspecified: Secondary | ICD-10-CM | POA: Diagnosis not present

## 2024-09-13 DIAGNOSIS — Z0000 Encounter for general adult medical examination without abnormal findings: Secondary | ICD-10-CM | POA: Diagnosis not present

## 2024-09-13 DIAGNOSIS — R7301 Impaired fasting glucose: Secondary | ICD-10-CM | POA: Diagnosis not present

## 2024-09-13 DIAGNOSIS — Z6833 Body mass index (BMI) 33.0-33.9, adult: Secondary | ICD-10-CM

## 2024-09-13 DIAGNOSIS — R5383 Other fatigue: Secondary | ICD-10-CM | POA: Diagnosis not present

## 2024-09-13 DIAGNOSIS — Z23 Encounter for immunization: Secondary | ICD-10-CM | POA: Diagnosis not present

## 2024-09-13 DIAGNOSIS — Z809 Family history of malignant neoplasm, unspecified: Secondary | ICD-10-CM

## 2024-09-13 LAB — LIPID PANEL

## 2024-09-13 NOTE — Progress Notes (Signed)
 Subjective: Chief Complaint  Patient presents with   Annual Exam    Fasting cpe, hormone- cycles and wonders if she is in perimenopausal, was dx with ADHD and on Wellbutrin for ADHD   Medical team: Dr. Si Butts, endocrinology Dr. Reggy Salt, pulmonology Sees eye doctor and dentist Dr. Devere Brave, gynecology, Dawn Barajas, [facility] Dermatology Dr. Rock Single, [facility], psychiatry Dawn Barajas, Dawn Barajas, Dawn Barajas here for primary care   Concerns: Dawn Barajas is a 38 year old female who presents for well visit.  She notes irregular menstrual cycles and possible perimenopausal symptoms.  She has experienced irregular menstrual cycles over the past few months, with previous regularity at 28 days now varying between 10 to 32 days. The flow remains consistent, but the timing is unpredictable. She had a full period after only 10 to 12 days on one occasion, and currently, she is over 30 days without a period.  She experiences episodes similar to hot flashes, described as 'mega, like hot, sweaty episodes,' occurring at various times of the day. Additionally, she notes a decrease in libido and general fatigue, which she attributes to stress, her children, and sleep apnea.  She has a history of sleep apnea and has difficulty using a CPAP machine due to feelings of claustrophobia. She initially used the CPAP for a few months but did not continue. She explored other options like mouthpieces and the Inspire implant but was not eligible for the latter at the time.  She was diagnosed with ADHD in the spring and is currently under the care of a psychiatrist and a Veterinary surgeon.   In terms of social history, she does not smoke and consumes alcohol occasionally, about one beer per week. She engages in regular physical activity, including leading five-mile walking tours every couple of weeks and playing sports with her children.  Family history is notable for her mother  having congestive heart failure diagnosed at around age 9. There is also a history of multiple cancers in the family, including breast, colon, thyroid , and pancreatic cancers. She previously underwent BRCA testing, which was negative.  She wants to consider medication to jump start weight loss.   Reviewed their medical, surgical, family, social, medication, and allergy history and updated chart as appropriate.  Past Medical History:  Diagnosis Date   ADHD 2025   Childhood asthma    Depression    History of chicken pox    IUD (intrauterine device) in place 2023   Lumbar degenerative disc disease    OSA (obstructive sleep apnea) 2023   Subclinical hyperthyroidism 2022   sees endo    Family History  Problem Relation Age of Onset   Diabetes Mother    Thyroid  cancer Mother    Breast cancer Mother 25   Colon cancer Mother 34   Depression Mother    Obesity Mother    Hypertension Mother    Heart failure Mother    Arthritis Mother    Cancer Mother    Heart disease Mother    Miscarriages / Stillbirths Mother    Skin cancer Father 77   Pancreatic cancer Father 41   Cancer Father    Depression Sister    Stroke Paternal Grandmother 31   Cancer Maternal Aunt      Current Outpatient Medications:    cholecalciferol (VITAMIN D3) 25 MCG (1000 UNIT) tablet, Take 1,000 Units by mouth daily., Disp: , Rfl:    escitalopram  (LEXAPRO ) 10 MG tablet, Take 1 tablet (10 mg total)  by mouth daily., Disp: 90 tablet, Rfl: 0   IUD'S IU, by Intrauterine route., Disp: , Rfl:    methimazole  (TAPAZOLE ) 5 MG tablet, Take 1 tablet (5 mg total) by mouth daily., Disp: 90 tablet, Rfl: 3   Multiple Vitamin (MULTIVITAMIN PO), Take by mouth., Disp: , Rfl:    WELLBUTRIN XL 300 MG 24 hr tablet, Take 300 mg by mouth every morning., Disp: , Rfl:    Multiple Vitamins-Minerals (VITAMIN D3 COMPLETE PO), Take by mouth., Disp: , Rfl:   No Known Allergies  Review of Systems  Constitutional:  Positive for  malaise/fatigue. Negative for chills, fever and weight loss.  HENT:  Negative for congestion, ear pain, hearing loss, sore throat and tinnitus.   Eyes:  Negative for blurred vision, pain and redness.  Respiratory:  Negative for cough, hemoptysis and shortness of breath.   Cardiovascular:  Negative for chest pain, palpitations, orthopnea, claudication and leg swelling.  Gastrointestinal:  Negative for abdominal pain, blood in stool, constipation, diarrhea, nausea and vomiting.  Genitourinary:  Negative for dysuria, flank pain, frequency (.pcp), hematuria and urgency.  Musculoskeletal:  Negative for falls, joint pain and myalgias.  Skin:  Negative for itching and rash.  Neurological:  Negative for dizziness, tingling, speech change, weakness and headaches.  Endo/Heme/Allergies:  Negative for polydipsia. Does not bruise/bleed easily.  Psychiatric/Behavioral:  Negative for depression and memory loss. The patient is not nervous/anxious and does not have insomnia.         09/13/2024    8:17 AM 09/09/2023    9:44 AM 09/02/2022    2:29 PM 02/14/2022    3:55 PM 08/22/2021    9:53 AM  Depression screen PHQ 2/9  Decreased Interest 0 1 0 0 0  Down, Depressed, Hopeless 0 0 0 0 0  PHQ - 2 Score 0 1 0 0 0  Altered sleeping 3 1 3     Tired, decreased energy 3 2 1     Change in appetite 0 1 0    Feeling bad or failure about yourself  0 0 0    Trouble concentrating 1 2 0    Moving slowly or fidgety/restless 1 1 0    Suicidal thoughts 0 0 0    PHQ-9 Score 8 8 4     Difficult doing work/chores Not difficult at all Somewhat difficult Not difficult at all         Objective:  BP 110/70   Pulse 76   Ht 5' 9 (1.753 m)   Wt 230 lb 3.2 oz (104.4 kg)   SpO2 97%   BMI 33.99 kg/m   General appearance: alert, no distress, WD/WN, Caucasian female Skin: unremarkable lesions, scattered macules HEENT: normocephalic, conjunctiva/corneas normal, sclerae anicteric, PERRLA, EOMi, nares patent, no discharge or  erythema, pharynx normal Oral cavity: MMM, tongue normal, teeth normal Neck: supple, no lymphadenopathy, no thyromegaly, no masses, normal ROM, no bruits Chest: non tender, normal shape and expansion Heart: RRR, normal S1, S2, no murmurs Lungs: CTA bilaterally, no wheezes, rhonchi, or rales Abdomen: +bs, soft, non tender, non distended, no masses, no hepatomegaly, no splenomegaly, no bruits Back: non tender, normal ROM, no scoliosis Musculoskeletal: upper extremities non tender, no obvious deformity, normal ROM throughout, lower extremities non tender, no obvious deformity, normal ROM throughout Extremities: no edema, no cyanosis, no clubbing Pulses: 2+ symmetric, upper and lower extremities, normal cap refill Neurological: alert, oriented x 3, CN2-12 intact, strength normal upper extremities and lower extremities, sensation normal throughout, DTRs 2+ throughout, no cerebellar  signs, gait normal Psychiatric: normal affect, behavior normal, pleasant  Breast/gyn/rectal - deferred to gynecology     Assessment and Plan :   Encounter Diagnoses  Name Primary?   Encounter for health maintenance examination in adult Yes   Needs flu shot    COVID-19 vaccine administered    Elevated lipids    Impaired fasting blood sugar    Fatigue, unspecified type    Hot flashes    BMI 33.0-33.9,adult    Family history of cancer      This visit was a preventative care visit, also known as wellness visit or routine physical.   Topics typically include healthy lifestyle, diet, exercise, preventative care, vaccinations, sick and well care, proper use of emergency dept and after hours care, as well as other concerns.     Recommendations: Continue to return yearly for your annual wellness and preventative care visits.  This gives us  a chance to discuss healthy lifestyle, exercise, vaccinations, review your chart record, and perform screenings where appropriate.  I recommend you see your eye doctor yearly  for routine vision care.  I recommend you see your dentist yearly for routine dental care including hygiene visits twice yearly.  See your gynecologist yearly for routine gynecological care.   Vaccination recommendations were reviewed Immunization History  Administered Date(s) Administered   Influenza Split 09/12/2016   Influenza, Seasonal, Injecte, Preservative Fre 09/09/2023   Influenza,inj,Quad PF,6+ Mos 08/22/2021, 09/02/2022   PFIZER(Purple Top)SARS-COV-2 Vaccination 02/17/2020, 03/24/2020, 11/22/2020   Tdap 09/25/2016    Counseled on the influenza virus vaccine.  Influenza vaccine given after consent obtained.  Counseled on the covid virus vaccine.   Covid vaccine given after consent obtained.   Screening for cancer: Colon cancer screening: Age 65  Breast cancer screening: Through gynecology  Cervical cancer screening: Through gynecology  Skin cancer screening: Check your skin regularly for new changes, growing lesions, or other lesions of concern Come in for evaluation if you have skin lesions of concern.  Lung cancer screening: If you have a greater than 20 pack year history of tobacco use, then you may qualify for lung cancer screening with a chest CT scan.   Please call your insurance company to inquire about coverage for this test.  We currently don't have screenings for other cancers besides breast, cervical, colon, and lung cancers.  If you have a strong family history of cancer or have other cancer screening concerns, please let me know.    Bone health: Get at least 150 minutes of aerobic exercise weekly Get weight bearing exercise at least once weekly Bone density test:  A bone density test is an imaging test that uses a type of X-ray to measure the amount of calcium and other minerals in your bones. The test may be used to diagnose or screen you for a condition that causes weak or thin bones (osteoporosis), predict your risk for a broken bone (fracture),  or determine how well your osteoporosis treatment is working. The bone density test is recommended for females 65 and older, or females or males <65 if certain risk factors such as thyroid  disease, long term use of steroids such as for asthma or rheumatological issues, vitamin D  deficiency, estrogen deficiency, family history of osteoporosis, self or family history of fragility fracture in first degree relative.    Heart health: Get at least 150 minutes of aerobic exercise weekly Limit alcohol It is important to maintain a healthy blood pressure and healthy cholesterol numbers  Heart disease screening: Screening  for heart disease includes screening for blood pressure, fasting lipids, glucose/diabetes screening, BMI height to weight ratio, reviewed of smoking status, physical activity, and diet.    Goals include blood pressure 120/80 or less, maintaining a healthy lipid/cholesterol profile, preventing diabetes or keeping diabetes numbers under good control, not smoking or using tobacco products, exercising most days per week or at least 150 minutes per week of exercise, and eating healthy variety of fruits and vegetables, healthy oils, and avoiding unhealthy food choices like fried food, fast food, high sugar and high cholesterol foods.    Other tests may possibly include EKG test, CT coronary calcium score, echocardiogram, exercise treadmill stress test.    Medical care options: I recommend you continue to seek care here first for routine care.  We try really hard to have available appointments Monday through Friday daytime hours for sick visits, acute visits, and physicals.  Urgent care should be used for after hours and weekends for significant issues that cannot wait till the next day.  The emergency department should be used for significant potentially life-threatening emergencies.  The emergency department is expensive, can often have long wait times for less significant concerns, so try to  utilize primary care, urgent care, or telemedicine when possible to avoid unnecessary trips to the emergency department.  Virtual visits and telemedicine have been introduced since the pandemic started in 2020, and can be convenient ways to receive medical care.  We offer virtual appointments as well to assist you in a variety of options to seek medical care.   Advanced Directives: I recommend you consider completing a Health Care Power of Attorney and Living Will.   These documents respect your wishes and help alleviate burdens on your loved ones if you were to become terminally ill or be in a position to need those documents enforced.    You can complete Advanced Directives yourself, have them notarized, then have copies made for our office, for you and for anybody you feel should have them in safe keeping.  Or, you can have an attorney prepare these documents.   If you haven't updated your Last Will and Testament in a while, it may be worthwhile having an attorney prepare these documents together and save on some costs.      Separate significant issues discussed: Obstructive sleep apnea - Discussed risks of untreated sleep apnea, including AFib and stroke. - Encouraged weight loss. - Advised against sleeping flat on back and using sedatives at night. - Consider revisiting CPAP use or exploring mouthpiece options, Inspire  BMI 30+ Discussed calorie deficit, diet, exercise, and medication options for weight loss. - Encouraged tracking calorie intake and creating a calorie deficit. - Recommended weight-bearing exercise at least two days a week. - Consider short-term use of appetite suppressants if labs are normal. - Discuss potential use of GLP-1 agonists if insurance covers. -discussed potential phentermine as a short term adjunct to help with efforts  Concern for Perimenopausal symptoms, hot flashes, sleep disturbance Irregular menstrual cycles, hot flashes, and decreased libido suggest  perimenopausal symptoms. Hormone levels were normal in April.  Thyroid  disorder with low thyroid  levels in April. -Managed by endocrinology, on methimazole  5 mg daily  Depression Managed with Lexapro  and Wellbutrin XL.  Attention-deficit hyperactivity disorder (ADHD) Managed by psychiatry.  Elevated lipids last year-recheck labs today  Impaired glucose last year-recheck labs today  Updated labs for evaluation of concerns above  Family history of cancer-referral to genetic counseling  Marillyn was seen today for annual  exam.  Diagnoses and all orders for this visit:  Encounter for health maintenance examination in adult -     CBC -     Comprehensive metabolic panel with GFR -     Lipid panel -     Hemoglobin A1c -     Urinalysis, Routine w reflex microscopic -     Cortisol  Needs flu shot -     Flu vaccine trivalent PF, 6mos and older(Flulaval,Afluria,Fluarix,Fluzone)  COVID-19 vaccine administered -     Pfizer Comirnaty Covid-19 Vaccine 75yrs & older  Elevated lipids -     Lipid panel  Impaired fasting blood sugar -     Hemoglobin A1c  Fatigue, unspecified type -     Cortisol  Hot flashes -     Cortisol  BMI 33.0-33.9,adult  Family history of cancer    Follow-up pending labs, yearly for physical

## 2024-09-14 ENCOUNTER — Ambulatory Visit: Payer: Self-pay | Admitting: Medical

## 2024-09-14 LAB — URINALYSIS, ROUTINE W REFLEX MICROSCOPIC
Bilirubin, UA: NEGATIVE
Glucose, UA: NEGATIVE
Ketones, UA: NEGATIVE
Leukocytes,UA: NEGATIVE
Nitrite, UA: NEGATIVE
RBC, UA: NEGATIVE
Specific Gravity, UA: 1.028 (ref 1.005–1.030)
Urobilinogen, Ur: 0.2 mg/dL (ref 0.2–1.0)
pH, UA: 5.5 (ref 5.0–7.5)

## 2024-09-14 LAB — COMPREHENSIVE METABOLIC PANEL WITH GFR
ALT: 29 IU/L (ref 0–32)
AST: 26 IU/L (ref 0–40)
Albumin: 4.3 g/dL (ref 3.9–4.9)
Alkaline Phosphatase: 64 IU/L (ref 41–116)
BUN/Creatinine Ratio: 17 (ref 9–23)
BUN: 13 mg/dL (ref 6–20)
Bilirubin Total: 0.8 mg/dL (ref 0.0–1.2)
CO2: 22 mmol/L (ref 20–29)
Calcium: 9 mg/dL (ref 8.7–10.2)
Chloride: 106 mmol/L (ref 96–106)
Creatinine, Ser: 0.78 mg/dL (ref 0.57–1.00)
Globulin, Total: 2.3 g/dL (ref 1.5–4.5)
Glucose: 106 mg/dL — AB (ref 70–99)
Potassium: 5.1 mmol/L (ref 3.5–5.2)
Sodium: 139 mmol/L (ref 134–144)
Total Protein: 6.6 g/dL (ref 6.0–8.5)
eGFR: 100 mL/min/1.73 (ref >59)

## 2024-09-14 LAB — LIPID PANEL
Cholesterol, Total: 233 mg/dL — AB (ref 100–199)
HDL: 35 mg/dL — AB (ref >39)
LDL CALC COMMENT:: 6.7 ratio — AB (ref 0.0–4.4)
LDL Chol Calc (NIH): 171 mg/dL — AB (ref 0–99)
Triglycerides: 146 mg/dL (ref 0–149)
VLDL Cholesterol Cal: 27 mg/dL (ref 5–40)

## 2024-09-14 LAB — CBC
Hematocrit: 42.6 % (ref 34.0–46.6)
Hemoglobin: 13.7 g/dL (ref 11.1–15.9)
MCH: 29.7 pg (ref 26.6–33.0)
MCHC: 32.2 g/dL (ref 31.5–35.7)
MCV: 92 fL (ref 79–97)
Platelets: 284 x10E3/uL (ref 150–450)
RBC: 4.61 x10E6/uL (ref 3.77–5.28)
RDW: 12.4 % (ref 11.7–15.4)
WBC: 6.5 x10E3/uL (ref 3.4–10.8)

## 2024-09-14 LAB — HEMOGLOBIN A1C
Est. average glucose Bld gHb Est-mCnc: 123 mg/dL
Hgb A1c MFr Bld: 5.9 % — ABNORMAL HIGH (ref 4.8–5.6)

## 2024-09-14 LAB — CORTISOL: Cortisol: 5.3 ug/dL — AB (ref 6.2–19.4)

## 2024-09-14 NOTE — Progress Notes (Signed)
 Results thru my chart

## 2024-09-20 ENCOUNTER — Encounter: Payer: Self-pay | Admitting: Genetic Counselor

## 2024-09-21 ENCOUNTER — Inpatient Hospital Stay

## 2024-09-21 ENCOUNTER — Encounter: Payer: Self-pay | Admitting: Genetic Counselor

## 2024-09-21 ENCOUNTER — Inpatient Hospital Stay: Attending: Genetic Counselor | Admitting: Genetic Counselor

## 2024-09-21 DIAGNOSIS — Z1379 Encounter for other screening for genetic and chromosomal anomalies: Secondary | ICD-10-CM

## 2024-09-21 DIAGNOSIS — Z8042 Family history of malignant neoplasm of prostate: Secondary | ICD-10-CM | POA: Diagnosis not present

## 2024-09-21 DIAGNOSIS — Z800 Family history of malignant neoplasm of digestive organs: Secondary | ICD-10-CM | POA: Insufficient documentation

## 2024-09-21 DIAGNOSIS — Z803 Family history of malignant neoplasm of breast: Secondary | ICD-10-CM | POA: Diagnosis not present

## 2024-09-21 NOTE — Progress Notes (Signed)
 REFERRING PROVIDER: Bulah Alm RAMAN, PA-C [address] [place],  [place] [address]  PRIMARY PROVIDER:  Bulah Alm RAMAN, PA-C  PRIMARY REASON FOR VISIT:  1. Family history of breast cancer   2. Family history of colon cancer   3. Family history of prostate cancer      HISTORY OF PRESENT ILLNESS:   Dawn Barajas, a 38 y.o. female, was seen for a [place] cancer genetics consultation at the request of [name], PS-C due to a family history of cancer.  Dawn Barajas presents to clinic today to discuss the possibility of a hereditary predisposition to cancer, genetic testing, and to further clarify her future cancer risks, as well as potential cancer risks for family members.   Dawn Barajas is a 38 y.o. female with no personal history of cancer.  She underwent genetic testing through her GYN office in 2021.  A multi-cancer gene panel was performed that consisted of 84 genes.  The testing was negative.  CANCER HISTORY:  Oncology History   No history exists.     RISK FACTORS:  Menarche was at age 55.  First live birth at age 52.  OCP use for approximately 4-5 years.  Ovaries intact: yes.  Hysterectomy: no.  Menopausal status: premenopausal.  HRT use: 0 years. Colonoscopy: no; not examined. Mammogram within the last year: yes. Number of breast biopsies: 0. Up to date with pelvic exams: yes. Any excessive radiation exposure in the past: no  Past Medical History:  Diagnosis Date   ADHD 2025   Childhood asthma    Depression    Family history of breast cancer    Family history of colon cancer    Family history of prostate cancer    History of chicken pox    IUD (intrauterine device) in place 2023   Lumbar degenerative disc disease    OSA (obstructive sleep apnea) 2023   Subclinical hyperthyroidism 2022   sees endo    Past Surgical History:  Procedure Laterality Date   FOREARM FRACTURE SURGERY     age 32   WISDOM TOOTH EXTRACTION      Social History    Socioeconomic History   Marital status: Married    Spouse name: Not on file   Number of children: Not on file   Years of education: Not on file   Highest education level: Master's degree (e.g., MA, MS, MEng, MEd, MSW, MBA)  Occupational History   Not on file  Tobacco Use   Smoking status: Never   Smokeless tobacco: Never  Vaping Use   Vaping status: Never Used  Substance and Sexual Activity   Alcohol use: Yes    Alcohol/week: 1.0 standard drink of alcohol    Types: 1 Cans of beer per week    Comment: one a week   Drug use: No   Sexual activity: Yes    Partners: Female    Birth control/protection: None  Other Topics Concern   Not on file  Social History Narrative   Lives with wife and 2 kids.   Exercise - walks a lot, sports with children.  Plans events for [employer], works for [employer].  08/2024   Social Drivers of Health   Financial Resource Strain: Low Risk  (09/13/2024)   Overall Financial Resource Strain (CARDIA)    Difficulty of Paying Living Expenses: Not hard at all  Food Insecurity: No Food Insecurity (09/13/2024)   Hunger Vital Sign    Worried About Programme Researcher, Broadcasting/film/video in  the Last Year: Never true    Ran Out of Food in the Last Year: Never true  Transportation Needs: No Transportation Needs (09/13/2024)   PRAPARE - Administrator, Civil Service (Medical): No    Lack of Transportation (Non-Medical): No  Physical Activity: Insufficiently Active (09/13/2024)   Exercise Vital Sign    Days of Exercise per Week: 2 days    Minutes of Exercise per Session: 60 min  Stress: Stress Concern Present (09/13/2024)   [employer] of Occupational Health - Occupational Stress Questionnaire    Feeling of Stress: To some extent  Social Connections: Socially Isolated (09/13/2024)   Social Connection and Isolation Panel    Frequency of Communication with Friends and Family: Once a week    Frequency of Social Gatherings with Friends and Family: Once  a week    Attends Religious Services: Never    Database Administrator or Organizations: No    Attends Engineer, Structural: Never    Marital Status: Married     FAMILY HISTORY:  We obtained a detailed, 4-generation family history.  Significant diagnoses are listed below: Family History  Problem Relation Age of Onset   Diabetes Mother    Thyroid  cancer Mother 69   Breast cancer Mother 36   Colon cancer Mother 20   Depression Mother    Obesity Mother    Hypertension Mother    Heart failure Mother    Arthritis Mother    Heart disease Mother    Miscarriages / Stillbirths Mother    Skin cancer Father 61   Pancreatic cancer Father 44   Depression Sister    Breast cancer Maternal Aunt 43   Sarcoidosis Maternal Aunt    Colon cancer Maternal Uncle        dx. x3   Non-Hodgkin's lymphoma Maternal Uncle    Prostate cancer Maternal Uncle    Prostate cancer Maternal Grandfather    Bladder Cancer Maternal Grandfather    Stroke Paternal Grandmother 60     The patient has one son who is cancer free.  She has a sister who is cancer free.  Her father is deceased and her mother is living.  The patient's father died of pancreatic cancer at 36.  He was an only child.  His parents were reportedly cancer free but he has a paternal first cousin with both breast and colon cancer.  The patient's mother had thyroid  cancer at 29, breast cancer at 33 and colon cancer at 53.  She has two full sisters, one who had breast cancer at 43, and a full brother who has had colon cancer 3 x and non-Hodgkin's lymphoma, and two paternal half brothers - one who had prostate cancer and one who had a daughter with breast cancer.  The paternal grandparents are deceased.  The grandfather had prostate and ureter/bladder cancer .   Dawn Barajas is unaware of previous family history of genetic testing for hereditary cancer risks. There is no reported Ashkenazi Jewish ancestry. There is no known  consanguinity.  GENETIC COUNSELING ASSESSMENT: Dawn Barajas is a 38 y.o. female with a family history of cancer which is somewhat suggestive of a hereditary cancer syndrome and predisposition to cancer given the number and combination of cases in the family. We, therefore, discussed and recommended the following at today's visit.   DISCUSSION: We discussed that, in general, most cancer is not inherited in families, but instead is sporadic or familial. Sporadic cancers occur by chance and  typically happen at older ages (>50 years) as this type of cancer is caused by genetic changes acquired during an individual's lifetime. Some families have more cancers than would be expected by chance; however, the ages or types of cancer are not consistent with a known genetic mutation or known genetic mutations have been ruled out. This type of familial cancer is thought to be due to a combination of multiple genetic, environmental, hormonal, and lifestyle factors. While this combination of factors likely increases the risk of cancer, the exact source of this risk is not currently identifiable or testable.  We discussed that 5 - 10% of cancer is hereditary.  Each type of cancer has its own risk for being hereditary with breast cancer having that same 5-10% risk, with most cases associated with BRCA mutation.  Colon cancer is most commonly associated with Lynch syndrome  There are other genes that can be associated with hereditary cancer syndromes which is why we typically test for multiple genes in a panel. We discussed that testing is beneficial for several reasons including knowing how to follow individuals after completing their treatment, and understand if other family members could be at risk for cancer and allow them to undergo genetic testing.   We reviewed the characteristics, features and inheritance patterns of hereditary cancer syndromes. We also discussed genetic testing, including the appropriate family members  to test, the process of testing,  and turn-around-time for results. We discussed the implications of a negative, positive, carrier and/or variant of uncertain significant result.   GENETIC TEST RESULTS: Genetic testing reported out on June 02, 2020 through the Multi-cancer panel found no pathogenic mutations. The test report has been scanned into EPIC and is located under the Molecular Pathology section of the Results Review tab.  A portion of the result report is included below for reference.     We discussed with Dawn Barajas that because current genetic testing is not perfect, it is possible there may be a gene mutation in one of these genes that current testing cannot detect, but that chance is small.  We also discussed, that there could be another gene that has not yet been discovered, or that we have not yet tested, that is responsible for the cancer diagnoses in the family. It is also possible there is a hereditary cause for the cancer in the family that Dawn Barajas did not inherit and therefore was not identified in her testing.  Therefore, it is important to remain in touch with cancer genetics in the future so that we can continue to offer Dawn Barajas the most up to date genetic testing.   ADDITIONAL GENETIC TESTING: We discussed with Dawn Barajas that her genetic testing was fairly extensive.  If there are genes identified to increase cancer risk that can be analyzed in the future, we would be happy to discuss and coordinate this testing at that time.    CANCER SCREENING RECOMMENDATIONS: Dawn Barajas's test result is considered negative (normal).  This means that we have not identified a hereditary cause for her family history of cancer at this time.   Possible reasons for Dawn Barajas's negative genetic test include:  1. There may be a gene mutation in one of these genes that current testing methods cannot detect but that chance is small.  2. There could be another gene that has not yet been  discovered, or that we have not yet tested, that is responsible for the cancer diagnoses in the family.  3.  There may be no hereditary risk for cancer in the family. The cancers in Dawn Barajas and/or her family may be sporadic/familial or due to other genetic and environmental factors. 4. It is also possible there is a hereditary cause for the cancer in the family that Dawn Barajas did not inherit.  Therefore, it is recommended she continue to follow the cancer management and screening guidelines provided by her  primary healthcare provider. An individual's cancer risk and medical management are not determined by genetic test results alone. Overall cancer risk assessment incorporates additional factors, including personal medical history, family history, and any available genetic information that may result in a personalized plan for cancer prevention and surveillance  The Tyrer-Cuzick model is one of multiple prediction models developed to estimate an individual's lifetime risk of developing breast cancer. The Tyrer-Cuzick model is endorsed by the [employer] (NCCN). This model includes many risk factors such as family history, endogenous estrogen exposure, and benign breast disease. The calculation is highly-dependent on the accuracy of clinical data provided by the patient and can change over time. The Tyrer-Cuzick model may be repeated to reflect new information in her personal or family history in the future.   Dawn Barajas has been determined to be at high risk for breast cancer.  her Tyrer-Cuzick risk score is 29.6%.  For women with a greater than 20% lifetime risk of breast cancer, the [employer] (NCCN) recommends the following:  1.      Clinical encounter every 6-12 months to begin when identified as being at increased risk, but not before age 11  2.      Annual mammograms. Tomosynthesis is recommended starting 10 years earlier than the youngest  breast cancer diagnosis in the family or at age 84 (whichever comes first), but not before age 62   10.      Annual breast MRI starting 10 years earlier than the youngest breast cancer diagnosis in the family or at age 25 (whichever comes first), but not before age 47.   We have made a referral to the [facility].      RECOMMENDATIONS FOR FAMILY MEMBERS:   Since she did not inherit a identifiable mutation in a cancer predisposition gene included on this panel, her children could not have inherited a known mutation from her in one of these genes. Individuals in this family might be at some increased risk of developing cancer, over the general population risk, simply due to the family history of cancer.  We recommended women in this family have a yearly mammogram beginning at age 73, or 23 years younger than the earliest onset of cancer, an annual clinical breast exam, and perform monthly breast self-exams. Women in this family should also have a gynecological exam as recommended by their primary provider. All family members should be referred for colonoscopy starting at age 47, or 66 years younger than the earliest onset of cancer. It is also possible there is a hereditary cause for the cancer in Dawn Barajas's family that she did not inherit and therefore was not identified in her.  Based on Dawn Barajas's family history, we recommended her mother, who was diagnosed with breast cancer at age 80 and colon cancer at 39, have genetic counseling and testing. Ms. Atkin will let us  know if we can be of any assistance in coordinating genetic counseling and/or testing for this family member.   PLAN:   1. Ms. Cabell will be referred  to the [facility] for follow up for her high risk for breast cancer. 2. Lastly, we encouraged Ms. Morford to remain in contact with cancer genetics every 3-5 years so that we can continuously update the family history and inform her of any changes in  cancer genetics and testing that may be of benefit for this family.   Ms. Esbenshade's questions were answered to her satisfaction today. Our contact information was provided should additional questions or concerns arise. Thank you for the referral and allowing us  to share in the care of your patient.   Abdulahad Mederos P. Perri, MS, CGC Licensed, Patent Attorney [email or web address] phone: [phone]  I personally spent a total of 70 minutes in the care of the patient today including preparing to see the patient, getting/reviewing separately obtained history, counseling and educating, referring and communicating with other health care professionals, documenting clinical information in the EHR, independently interpreting results, and communicating results.   The patient was seen alone.  Drs. Lanny Stalls, and/or Gudena were available for questions, if needed..    _______________________________________________________________________ For Office Staff:  Number of people involved in session: 1 Was an Intern/ student involved with case: no

## 2024-11-15 ENCOUNTER — Ambulatory Visit (HOSPITAL_COMMUNITY)
Admission: EM | Admit: 2024-11-15 | Discharge: 2024-11-15 | Disposition: A | Discharge status: Home / Self Care | Attending: Physician Assistant | Admitting: Physician Assistant

## 2024-11-15 ENCOUNTER — Ambulatory Visit (HOSPITAL_COMMUNITY)

## 2024-11-15 ENCOUNTER — Encounter (HOSPITAL_COMMUNITY): Payer: Self-pay

## 2024-11-15 DIAGNOSIS — R051 Acute cough: Secondary | ICD-10-CM

## 2024-11-15 DIAGNOSIS — J40 Bronchitis, not specified as acute or chronic: Secondary | ICD-10-CM | POA: Diagnosis not present

## 2024-11-15 DIAGNOSIS — J329 Chronic sinusitis, unspecified: Secondary | ICD-10-CM | POA: Diagnosis not present

## 2024-11-15 MED ORDER — ALBUTEROL SULFATE HFA 108 (90 BASE) MCG/ACT IN AERS
INHALATION_SPRAY | RESPIRATORY_TRACT | Status: AC
  Filled 2024-11-15: qty 6.7

## 2024-11-15 MED ORDER — AMOXICILLIN-POT CLAVULANATE 875-125 MG PO TABS: 1.0000 | ORAL_TABLET | Freq: Two times a day (BID) | ORAL | 0 refills | Status: AC

## 2024-11-15 MED ORDER — ALBUTEROL SULFATE HFA 108 (90 BASE) MCG/ACT IN AERS
2.0000 | INHALATION_SPRAY | Freq: Once | RESPIRATORY_TRACT | Status: AC
  Administered 2024-11-15: 2 via RESPIRATORY_TRACT

## 2024-11-15 NOTE — ED Provider Notes (Signed)
 " [facility]    CSN: [id number] Arrival date & time: 11/15/24  1521      History   Chief Complaint Chief Complaint  Patient presents with   Cough   Shortness of Breath    HPI Dawn Barajas is a 38 y.o. female.   Patient presents today with a 1 day history of recurrent cough, chest tightness, shortness of breath.  She reports that last week she was feeling very poorly and took an at home COVID/flu test that was positive for influenza B.  She used over-the-counter medication and had significant improvement of symptoms until recurrence earlier today.  Reports that earlier today she was sitting in her work when she suddenly felt hot and had a bad coughing fit where she could not catch her breath or take a deep breath.  She has also had a significant headache for the past few days that is worse in her left frontal region.  She denies any significant congestion, fever, chest pain, vomiting, diarrhea.  She denies any recent antibiotics or steroids.  She does have a history of childhood asthma per her chart but has not required albuterol  in adulthood.  Denies history of smoking.    Past Medical History:  Diagnosis Date   ADHD 2025   Childhood asthma    Depression    Family history of breast cancer    Family history of colon cancer    Family history of prostate cancer    History of chicken pox    IUD (intrauterine device) in place 2023   Lumbar degenerative disc disease    OSA (obstructive sleep apnea) 2023   Subclinical hyperthyroidism 2022   sees endo    Patient Active Problem List   Diagnosis Date Noted   Family history of breast cancer    Family history of colon cancer    Family history of prostate cancer    Elevated lipids 09/13/2024   Impaired fasting blood sugar 09/13/2024   Hot flashes 09/13/2024   BMI 33.0-33.9,adult 09/13/2024   Encounter for health maintenance examination in adult 09/02/2022   Need for influenza vaccination 09/02/2022   OSA  (obstructive sleep apnea) 09/02/2022   Obstructive sleep apnea 05/24/2022   Depression, major, recurrent, 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   Mixed hyperlipidemia 08/22/2021   Seborrheic dermatitis 07/10/2021   Subclinical hyperthyroidism 07/09/2021   Rash 05/31/2021   Vitamin D  deficiency 05/31/2021   Low TSH level 02/13/2021   Family history of thyroid  cancer 02/13/2021   Elevated ALT measurement 04/18/2020   Depression 03/02/2020   Fatigue 03/02/2020   Menorrhagia with regular cycle 03/02/2020   Sinusitis 03/02/2018   Conjunctivitis 03/02/2018   Routine general medical examination at a health care facility 05/23/2017    Past Surgical History:  Procedure Laterality Date   FOREARM FRACTURE SURGERY     age 37   WISDOM TOOTH EXTRACTION      OB History   No obstetric history on file.      Home Medications    Prior to Admission medications  Medication Sig Start Date End Date Taking? Authorizing Provider  amoxicillin -clavulanate (AUGMENTIN ) 875-125 MG tablet Take 1 tablet by mouth every 12 (twelve) hours. 11/15/24  Yes Jarek Longton, Rocky POUR, PA-C  cholecalciferol (VITAMIN D3) 25 MCG (1000 UNIT) tablet Take 1,000 Units by mouth daily.    [provider]  escitalopram  (LEXAPRO ) 10 MG tablet Take 1 tablet (10 mg total) by mouth daily. 07/16/24   Tysinger, Alm RAMAN, PA-C  IUD'S IU by Intrauterine  route.    [provider]  methimazole  (TAPAZOLE ) 5 MG tablet Take 1 tablet (5 mg total) by mouth daily. 03/09/24   Shamleffer, Ibtehal Jaralla, MD  Multiple Vitamin (MULTIVITAMIN PO) Take by mouth.    [provider]  Multiple Vitamins-Minerals (VITAMIN D3 COMPLETE PO) Take by mouth.    [provider]  WELLBUTRIN XL 300 MG 24 hr tablet Take 300 mg by mouth every morning. 08/31/24   [provider]    Family History Family History  Problem Relation Age of Onset   Diabetes Mother    Thyroid  cancer Mother 35   Breast cancer Mother 65   Colon cancer Mother 72    Depression Mother    Obesity Mother    Hypertension Mother    Heart failure Mother    Arthritis Mother    Heart disease Mother    Miscarriages / Stillbirths Mother    Skin cancer Father 29   Pancreatic cancer Father 76   Depression Sister    Breast cancer Maternal Aunt 39   Sarcoidosis Maternal Aunt    Colon cancer Maternal Uncle        dx. x3   Non-Hodgkin's lymphoma Maternal Uncle    Prostate cancer Maternal Uncle    Prostate cancer Maternal Grandfather    Bladder Cancer Maternal Grandfather    Stroke Paternal Grandmother 91    Social History Social History[1]   Allergies   Patient has no known allergies.   Review of Systems Review of Systems  Constitutional:  Positive for activity change. Negative for appetite change, fatigue and fever.  HENT:  Positive for sinus pressure. Negative for congestion, sneezing and sore throat.   Respiratory:  Positive for cough, chest tightness and shortness of breath. Negative for wheezing.   Cardiovascular:  Negative for chest pain.  Gastrointestinal:  Negative for diarrhea, nausea and vomiting.  Neurological:  Positive for headaches. Negative for dizziness and light-headedness.     Physical Exam Triage Vital Signs ED Triage Vitals [11/15/24 1628]  Encounter Vitals Group     BP 133/79     Girls Systolic BP Percentile      Girls Diastolic BP Percentile      Boys Systolic BP Percentile      Boys Diastolic BP Percentile      Pulse Rate 74     Resp 18     Temp 98.3 F (36.8 C)     Temp src      SpO2 99 %     Weight      Height      Head Circumference      Peak Flow      Pain Score      Pain Loc      Pain Education      Exclude from Growth Chart    No data found.  Updated Vital Signs BP 133/79 (BP Location: Right Arm)   Pulse 74   Temp 98.3 F (36.8 C)   Resp 18   SpO2 99%   Visual Acuity Right Eye Distance:   Left Eye Distance:   Bilateral Distance:    Right Eye Near:   Left Eye Near:    Bilateral Near:      Physical Exam Vitals reviewed.  Constitutional:      General: She is awake. She is not in acute distress.    Appearance: Normal appearance. She is well-developed. She is not ill-appearing.     Comments: Very pleasant female appears stated age  in no acute distress sitting comfortably in exam room  HENT:     Head: Normocephalic and atraumatic.     Right Ear: Tympanic membrane, ear canal and external ear normal. Tympanic membrane is not erythematous or bulging.     Left Ear: Tympanic membrane, ear canal and external ear normal. Tympanic membrane is not erythematous or bulging.     Nose:     Right Sinus: No maxillary sinus tenderness or frontal sinus tenderness.     Left Sinus: Maxillary sinus tenderness and frontal sinus tenderness present.     Mouth/Throat:     Pharynx: Uvula midline. No oropharyngeal exudate or posterior oropharyngeal erythema.  Eyes:     Extraocular Movements: Extraocular movements intact.     Conjunctiva/sclera: Conjunctivae normal.     Pupils: Pupils are equal, round, and reactive to light.  Cardiovascular:     Rate and Rhythm: Normal rate and regular rhythm.     Heart sounds: Normal heart sounds, S1 normal and S2 normal. No murmur heard. Pulmonary:     Effort: Pulmonary effort is normal.     Breath sounds: Examination of the right-lower field reveals decreased breath sounds. Examination of the left-lower field reveals decreased breath sounds. Decreased breath sounds present. No wheezing, rhonchi or rales.  Musculoskeletal:     Cervical back: Normal range of motion and neck supple.     Comments: Strength 5/5 bilateral upper and lower extremities  Neurological:     General: No focal deficit present.     Mental Status: She is alert and oriented to person, place, and time.     Cranial Nerves: Cranial nerves 2-12 are intact.     Motor: Motor function is intact.     Coordination: Coordination is intact.     Gait: Gait is intact.     Comments: Cranial nerves II  through XII grossly intact.  No focal neurologic defect on exam.  Psychiatric:        Behavior: Behavior is cooperative.      UC Treatments / Results  Labs (all labs ordered are listed, but only abnormal results are displayed) Labs Reviewed - No data to display  EKG   Radiology DG Chest 2 View Result Date: 11/15/2024 CLINICAL DATA:  Cough. EXAM: CHEST - 2 VIEW COMPARISON:  None Available. FINDINGS: The heart size and mediastinal contours are within normal limits. Both lungs are clear. The visualized skeletal structures are unremarkable. IMPRESSION: No active cardiopulmonary disease. Electronically Signed   By: Vanetta Chou M.D.   On: 11/15/2024 17:58    Procedures Procedures (including critical care time)  Medications Ordered in UC Medications  albuterol  (VENTOLIN  HFA) 108 (90 Base) MCG/ACT inhaler 2 puff (2 puffs Inhalation Given 11/15/24 1730)    Initial Impression / Assessment and Plan / UC Course  I have reviewed the triage vital signs and the nursing notes.  Pertinent labs & imaging results that were available during my care of the patient were reviewed by me and considered in my medical decision making (see chart for details).     Patient is well-appearing, afebrile, nontoxic, nontachycardic.  Viral testing was deferred as she has already recovered from the flu and has had symptoms for over a week.  Chest x-ray was obtained that showed no acute cardiopulmonary disease.  Given her prolonged and recent worsening of symptoms will cover for sinobronchitis with Augmentin  twice daily for 10 days.  No indication for dose adjustment based on metabolic panel from 09/13/2024 with a creatinine of 0.78  and calculated creatinine clearance of 161 mL/min.  She was given albuterol  inhaler in clinic which provided improvement of chest tightness and coughing symptoms.  She was sent home with this medication to be used every 4-6 hours as needed.  Recommended that she use over-the-counter  medication including Mucinex, Flonase, nasal saline/sinus rinses, Tylenol, ibuprofen, humidifier in her room.  We discussed that if she is not feeling better within a few days that she should return for reevaluation but if anything worsens and she has worsening cough, shortness of breath, fever, weakness, nausea/vomiting interfere with oral intake she needs to be seen immediately.  Return precautions given.  All questions answered to patient satisfaction she expressed understanding and agreement with the plan.  Final Clinical Impressions(s) / UC Diagnoses   Final diagnoses:  Acute cough  Sinobronchitis     Discharge Instructions      They did not see any pneumonia on your x-ray.  Start Augmentin  twice daily for 10 days.  Use the albuterol  inhaler every 4-6 hours as needed.  Use over-the-counter medication including Mucinex, Flonase, nasal saline/sinus rinses, humidifier in your room.  Make sure that you are resting and drinking plenty of fluid.  If you are not feeling better within 3 to 5 days please return for reevaluation.  If anything worsens and you have increasing cough, shortness of breath, fever, weakness, nausea/vomiting you need to be seen immediately.     ED Prescriptions     Medication Sig Dispense Auth. Provider   amoxicillin -clavulanate (AUGMENTIN ) 875-125 MG tablet Take 1 tablet by mouth every 12 (twelve) hours. 20 tablet Maan Zarcone K, PA-C      PDMP not reviewed this encounter.     [1]  Social History Tobacco Use   Smoking status: Never   Smokeless tobacco: Never  Vaping Use   Vaping status: Never Used  Substance Use Topics   Alcohol use: Yes    Alcohol/week: 1.0 standard drink of alcohol    Types: 1 Cans of beer per week    Comment: one a week   Drug use: No     Sherrell Rocky POUR, PA-C 11/15/24 1814  "

## 2024-11-15 NOTE — ED Triage Notes (Signed)
 Pt states had the flu last week. States today while sitting in her her office had a coughing  spell with chest tightness and SOB. States also having a headache. Denies taken any meds for sx's.

## 2024-11-15 NOTE — Discharge Instructions (Addendum)
 They did not see any pneumonia on your x-ray.  Start Augmentin  twice daily for 10 days.  Use the albuterol  inhaler every 4-6 hours as needed.  Use over-the-counter medication including Mucinex, Flonase, nasal saline/sinus rinses, humidifier in your room.  Make sure that you are resting and drinking plenty of fluid.  If you are not feeling better within 3 to 5 days please return for reevaluation.  If anything worsens and you have increasing cough, shortness of breath, fever, weakness, nausea/vomiting you need to be seen immediately.

## 2024-12-20 ENCOUNTER — Encounter: Payer: Self-pay | Admitting: *Deleted

## 2025-03-08 ENCOUNTER — Ambulatory Visit: Admitting: Internal Medicine

## 2025-09-16 ENCOUNTER — Encounter: Payer: Self-pay | Admitting: Medical
